# Patient Record
Sex: Female | Born: 1970 | Race: Black or African American | Hispanic: No | Marital: Single | Smoking: Never smoker
Health system: Southern US, Community
[De-identification: ages and names within clinical notes are randomized; demographics above are authoritative.]

## PROBLEM LIST (undated history)

## (undated) DIAGNOSIS — D649 Anemia, unspecified: Secondary | ICD-10-CM

## (undated) DIAGNOSIS — K219 Gastro-esophageal reflux disease without esophagitis: Secondary | ICD-10-CM

## (undated) DIAGNOSIS — D242 Benign neoplasm of left breast: Secondary | ICD-10-CM

## (undated) DIAGNOSIS — I10 Essential (primary) hypertension: Secondary | ICD-10-CM

## (undated) DIAGNOSIS — J45909 Unspecified asthma, uncomplicated: Secondary | ICD-10-CM

## (undated) DIAGNOSIS — N189 Chronic kidney disease, unspecified: Secondary | ICD-10-CM

## (undated) DIAGNOSIS — Z8639 Personal history of other endocrine, nutritional and metabolic disease: Secondary | ICD-10-CM

## (undated) DIAGNOSIS — A6000 Herpesviral infection of urogenital system, unspecified: Secondary | ICD-10-CM

## (undated) DIAGNOSIS — E119 Type 2 diabetes mellitus without complications: Secondary | ICD-10-CM

## (undated) DIAGNOSIS — Z9289 Personal history of other medical treatment: Secondary | ICD-10-CM

## (undated) HISTORY — PX: TUBAL LIGATION: SHX77

## (undated) HISTORY — DX: Herpesviral infection of urogenital system, unspecified: A60.00

## (undated) HISTORY — PX: WISDOM TOOTH EXTRACTION: SHX21

## (undated) HISTORY — DX: Chronic kidney disease, unspecified: N18.9

---

## 1997-11-18 ENCOUNTER — Other Ambulatory Visit: Admission: RE | Admit: 1997-11-18 | Discharge: 1997-11-18 | Payer: Self-pay | Admitting: Obstetrics and Gynecology

## 1997-12-02 ENCOUNTER — Other Ambulatory Visit: Admission: RE | Admit: 1997-12-02 | Discharge: 1997-12-02 | Payer: Self-pay | Admitting: Obstetrics & Gynecology

## 1997-12-29 ENCOUNTER — Other Ambulatory Visit: Admission: RE | Admit: 1997-12-29 | Discharge: 1997-12-29 | Payer: Self-pay | Admitting: Obstetrics & Gynecology

## 1998-01-12 ENCOUNTER — Other Ambulatory Visit: Admission: RE | Admit: 1998-01-12 | Discharge: 1998-01-12 | Payer: Self-pay | Admitting: Obstetrics and Gynecology

## 1998-01-15 ENCOUNTER — Inpatient Hospital Stay (HOSPITAL_COMMUNITY): Admission: AD | Admit: 1998-01-15 | Discharge: 1998-01-15 | Payer: Self-pay | Admitting: Obstetrics & Gynecology

## 1998-01-27 ENCOUNTER — Other Ambulatory Visit: Admission: RE | Admit: 1998-01-27 | Discharge: 1998-01-27 | Payer: Self-pay | Admitting: Obstetrics and Gynecology

## 1998-01-28 ENCOUNTER — Inpatient Hospital Stay (HOSPITAL_COMMUNITY): Admission: AD | Admit: 1998-01-28 | Discharge: 1998-01-30 | Payer: Self-pay | Admitting: Obstetrics and Gynecology

## 1998-04-11 ENCOUNTER — Ambulatory Visit (HOSPITAL_COMMUNITY): Admission: RE | Admit: 1998-04-11 | Discharge: 1998-04-11 | Payer: Self-pay | Admitting: Obstetrics & Gynecology

## 2002-01-09 ENCOUNTER — Encounter: Payer: Self-pay | Admitting: Emergency Medicine

## 2002-01-09 ENCOUNTER — Emergency Department (HOSPITAL_COMMUNITY): Admission: EM | Admit: 2002-01-09 | Discharge: 2002-01-10 | Payer: Self-pay | Admitting: *Deleted

## 2007-04-23 ENCOUNTER — Encounter: Admission: RE | Admit: 2007-04-23 | Discharge: 2007-04-23 | Payer: Self-pay | Admitting: Internal Medicine

## 2010-07-27 ENCOUNTER — Emergency Department (HOSPITAL_COMMUNITY)
Admission: EM | Admit: 2010-07-27 | Discharge: 2010-07-28 | Payer: Self-pay | Source: Home / Self Care | Admitting: Emergency Medicine

## 2010-10-31 LAB — DIFFERENTIAL
Basophils Absolute: 0 10*3/uL (ref 0.0–0.1)
Basophils Relative: 0 % (ref 0–1)
Eosinophils Absolute: 0.1 10*3/uL (ref 0.0–0.7)
Eosinophils Relative: 1 % (ref 0–5)
Lymphocytes Relative: 34 % (ref 12–46)
Lymphs Abs: 2.8 10*3/uL (ref 0.7–4.0)
Monocytes Absolute: 0.4 10*3/uL (ref 0.1–1.0)
Monocytes Relative: 5 % (ref 3–12)
Neutro Abs: 4.8 10*3/uL (ref 1.7–7.7)
Neutrophils Relative %: 60 % (ref 43–77)

## 2010-10-31 LAB — CBC
HCT: 27.3 % — ABNORMAL LOW (ref 36.0–46.0)
Hemoglobin: 8.2 g/dL — ABNORMAL LOW (ref 12.0–15.0)
MCH: 20.7 pg — ABNORMAL LOW (ref 26.0–34.0)
MCHC: 30 g/dL (ref 30.0–36.0)
MCV: 68.8 fL — ABNORMAL LOW (ref 78.0–100.0)
Platelets: 294 10*3/uL (ref 150–400)
RBC: 3.97 MIL/uL (ref 3.87–5.11)
RDW: 17.7 % — ABNORMAL HIGH (ref 11.5–15.5)
WBC: 8.1 10*3/uL (ref 4.0–10.5)

## 2010-10-31 LAB — COMPREHENSIVE METABOLIC PANEL
ALT: 23 U/L (ref 0–35)
AST: 31 U/L (ref 0–37)
Albumin: 3.5 g/dL (ref 3.5–5.2)
Alkaline Phosphatase: 59 U/L (ref 39–117)
BUN: 9 mg/dL (ref 6–23)
CO2: 25 mEq/L (ref 19–32)
Calcium: 9.5 mg/dL (ref 8.4–10.5)
Chloride: 105 mEq/L (ref 96–112)
Creatinine, Ser: 1.11 mg/dL (ref 0.4–1.2)
GFR calc Af Amer: >60 mL/min (ref >60)
GFR calc non Af Amer: 55 mL/min — ABNORMAL LOW (ref >60)
Glucose, Bld: 137 mg/dL — ABNORMAL HIGH (ref 70–99)
Potassium: 3.3 mEq/L — ABNORMAL LOW (ref 3.5–5.1)
Sodium: 136 mEq/L (ref 135–145)
Total Bilirubin: 0.4 mg/dL (ref 0.3–1.2)
Total Protein: 7.4 g/dL (ref 6.0–8.3)

## 2011-01-17 ENCOUNTER — Other Ambulatory Visit: Payer: Self-pay | Admitting: Family Medicine

## 2011-01-17 ENCOUNTER — Ambulatory Visit
Admission: RE | Admit: 2011-01-17 | Discharge: 2011-01-17 | Disposition: A | Discharge status: Home / Self Care | Payer: Medicaid Other | Source: Ambulatory Visit | Attending: Family Medicine | Admitting: Family Medicine

## 2011-01-17 DIAGNOSIS — R000 Tachycardia, unspecified: Secondary | ICD-10-CM

## 2011-01-17 DIAGNOSIS — I10 Essential (primary) hypertension: Secondary | ICD-10-CM

## 2013-05-06 ENCOUNTER — Inpatient Hospital Stay (HOSPITAL_COMMUNITY)
Admission: EM | Admit: 2013-05-06 | Discharge: 2013-05-07 | DRG: 812 | Disposition: A | Discharge status: Home / Self Care | Payer: Medicaid Other | Attending: Internal Medicine | Admitting: Internal Medicine

## 2013-05-06 ENCOUNTER — Emergency Department (HOSPITAL_COMMUNITY): Payer: Medicaid Other

## 2013-05-06 ENCOUNTER — Encounter (HOSPITAL_COMMUNITY): Payer: Self-pay | Admitting: Emergency Medicine

## 2013-05-06 ENCOUNTER — Inpatient Hospital Stay (HOSPITAL_COMMUNITY): Payer: Medicaid Other

## 2013-05-06 DIAGNOSIS — I2489 Other forms of acute ischemic heart disease: Secondary | ICD-10-CM | POA: Diagnosis present

## 2013-05-06 DIAGNOSIS — E876 Hypokalemia: Secondary | ICD-10-CM | POA: Diagnosis present

## 2013-05-06 DIAGNOSIS — D62 Acute posthemorrhagic anemia: Principal | ICD-10-CM | POA: Diagnosis present

## 2013-05-06 DIAGNOSIS — R079 Chest pain, unspecified: Secondary | ICD-10-CM | POA: Diagnosis present

## 2013-05-06 DIAGNOSIS — D649 Anemia, unspecified: Secondary | ICD-10-CM

## 2013-05-06 DIAGNOSIS — R531 Weakness: Secondary | ICD-10-CM | POA: Diagnosis present

## 2013-05-06 DIAGNOSIS — Z79899 Other long term (current) drug therapy: Secondary | ICD-10-CM

## 2013-05-06 DIAGNOSIS — I248 Other forms of acute ischemic heart disease: Secondary | ICD-10-CM | POA: Diagnosis present

## 2013-05-06 DIAGNOSIS — D509 Iron deficiency anemia, unspecified: Secondary | ICD-10-CM | POA: Diagnosis present

## 2013-05-06 DIAGNOSIS — R911 Solitary pulmonary nodule: Secondary | ICD-10-CM | POA: Diagnosis present

## 2013-05-06 DIAGNOSIS — I10 Essential (primary) hypertension: Secondary | ICD-10-CM | POA: Diagnosis present

## 2013-05-06 HISTORY — DX: Essential (primary) hypertension: I10

## 2013-05-06 LAB — CBC
MCH: 16.8 pg — ABNORMAL LOW (ref 26.0–34.0)
MCH: 17.3 pg — ABNORMAL LOW (ref 26.0–34.0)
MCHC: 27.5 g/dL — ABNORMAL LOW (ref 30.0–36.0)
MCHC: 28.4 g/dL — ABNORMAL LOW (ref 30.0–36.0)
Platelets: 320 10*3/uL (ref 150–400)
RDW: 19.6 % — ABNORMAL HIGH (ref 11.5–15.5)
RDW: 19.6 % — ABNORMAL HIGH (ref 11.5–15.5)

## 2013-05-06 LAB — RAPID URINE DRUG SCREEN, HOSP PERFORMED
Amphetamines: NOT DETECTED
Benzodiazepines: NOT DETECTED
Opiates: NOT DETECTED

## 2013-05-06 LAB — BASIC METABOLIC PANEL
Calcium: 10.2 mg/dL (ref 8.4–10.5)
GFR calc Af Amer: >90 mL/min (ref >90)
GFR calc non Af Amer: 81 mL/min — ABNORMAL LOW (ref >90)
Potassium: 2.4 mEq/L — CL (ref 3.5–5.1)
Sodium: 137 mEq/L (ref 135–145)

## 2013-05-06 LAB — COMPREHENSIVE METABOLIC PANEL
Alkaline Phosphatase: 49 U/L (ref 39–117)
BUN: 9 mg/dL (ref 6–23)
Chloride: 97 mEq/L (ref 96–112)
Creatinine, Ser: 0.81 mg/dL (ref 0.50–1.10)
GFR calc Af Amer: >90 mL/min (ref >90)
GFR calc non Af Amer: 88 mL/min — ABNORMAL LOW (ref >90)
Glucose, Bld: 102 mg/dL — ABNORMAL HIGH (ref 70–99)
Potassium: 2.7 mEq/L — CL (ref 3.5–5.1)
Total Bilirubin: 0.2 mg/dL — ABNORMAL LOW (ref 0.3–1.2)

## 2013-05-06 LAB — RETICULOCYTES: RBC.: 3.75 MIL/uL — ABNORMAL LOW (ref 3.87–5.11)

## 2013-05-06 LAB — POCT I-STAT TROPONIN I: Troponin i, poc: 0.01 ng/mL (ref 0.00–0.08)

## 2013-05-06 LAB — TROPONIN I: Troponin I: <0.3 ng/mL (ref <0.30)

## 2013-05-06 LAB — MAGNESIUM: Magnesium: 1.2 mg/dL — ABNORMAL LOW (ref 1.5–2.5)

## 2013-05-06 LAB — ABO/RH: ABO/RH(D): AB POS

## 2013-05-06 LAB — PHOSPHORUS: Phosphorus: 3.4 mg/dL (ref 2.3–4.6)

## 2013-05-06 MED ORDER — POTASSIUM CHLORIDE CRYS ER 20 MEQ PO TBCR
40.0000 meq | EXTENDED_RELEASE_TABLET | Freq: Once | ORAL | Status: AC
  Administered 2013-05-06: 40 meq via ORAL
  Filled 2013-05-06: qty 2

## 2013-05-06 MED ORDER — SODIUM CHLORIDE 0.9 % IV SOLN
INTRAVENOUS | Status: AC
  Administered 2013-05-06: 20:00:00 via INTRAVENOUS

## 2013-05-06 MED ORDER — HYDROMORPHONE HCL PF 1 MG/ML IJ SOLN
1.0000 mg | INTRAMUSCULAR | Status: DC | PRN
  Administered 2013-05-06: 1 mg via INTRAVENOUS
  Filled 2013-05-06: qty 1

## 2013-05-06 MED ORDER — SIMVASTATIN 10 MG PO TABS
10.0000 mg | ORAL_TABLET | Freq: Every day | ORAL | Status: DC
  Administered 2013-05-07: 10 mg via ORAL
  Filled 2013-05-06: qty 1

## 2013-05-06 MED ORDER — INFLUENZA VAC SPLIT QUAD 0.5 ML IM SUSP
0.5000 mL | INTRAMUSCULAR | Status: AC | PRN
  Administered 2013-05-07: 18:00:00 0.5 mL via INTRAMUSCULAR
  Filled 2013-05-06 (×2): qty 0.5

## 2013-05-06 MED ORDER — PANTOPRAZOLE SODIUM 40 MG PO TBEC
40.0000 mg | DELAYED_RELEASE_TABLET | Freq: Every day | ORAL | Status: DC
  Administered 2013-05-07: 10:00:00 40 mg via ORAL
  Filled 2013-05-06: qty 1

## 2013-05-06 MED ORDER — SODIUM CHLORIDE 0.9 % IV SOLN: INTRAVENOUS | Status: AC

## 2013-05-06 MED ORDER — SODIUM CHLORIDE 0.9 % IJ SOLN: 3.0000 mL | Freq: Two times a day (BID) | INTRAMUSCULAR | Status: DC

## 2013-05-06 MED ORDER — POTASSIUM CHLORIDE CRYS ER 20 MEQ PO TBCR
40.0000 meq | EXTENDED_RELEASE_TABLET | Freq: Once | ORAL | Status: AC
  Administered 2013-05-06: 40 meq via ORAL
  Filled 2013-05-06 (×2): qty 2

## 2013-05-06 MED ORDER — ONDANSETRON HCL 4 MG PO TABS: 4.0000 mg | ORAL_TABLET | Freq: Four times a day (QID) | ORAL | Status: DC | PRN

## 2013-05-06 MED ORDER — POTASSIUM CHLORIDE 10 MEQ/100ML IV SOLN
10.0000 meq | Freq: Once | INTRAVENOUS | Status: AC
  Administered 2013-05-06: 10 meq via INTRAVENOUS
  Filled 2013-05-06 (×2): qty 100

## 2013-05-06 MED ORDER — AMLODIPINE BESYLATE 10 MG PO TABS
10.0000 mg | ORAL_TABLET | Freq: Every day | ORAL | Status: DC
  Administered 2013-05-07: 10:00:00 10 mg via ORAL
  Filled 2013-05-06: qty 1

## 2013-05-06 MED ORDER — MAGNESIUM SULFATE 40 MG/ML IJ SOLN
4.0000 g | Freq: Once | INTRAMUSCULAR | Status: AC
  Administered 2013-05-06: 23:00:00 4 g via INTRAVENOUS
  Filled 2013-05-06: qty 100

## 2013-05-06 MED ORDER — HYDROCODONE-ACETAMINOPHEN 5-325 MG PO TABS
1.0000 | ORAL_TABLET | ORAL | Status: DC | PRN
  Filled 2013-05-06: qty 1

## 2013-05-06 MED ORDER — ONDANSETRON HCL 4 MG/2ML IJ SOLN: 4.0000 mg | Freq: Four times a day (QID) | INTRAMUSCULAR | Status: DC | PRN

## 2013-05-06 NOTE — Progress Notes (Signed)
Utilization Review completed.  Nevaeha Finerty RN CM  

## 2013-05-06 NOTE — ED Provider Notes (Signed)
CSN: [id number]     Arrival date & time 05/06/13  1607 History   First MD Initiated Contact with Patient 05/06/13 1610     Chief Complaint  Patient presents with  . Chest Pain   (Consider location/radiation/quality/duration/timing/severity/associated sxs/prior Treatment) HPI Comments: 42 year old female with a past medical history of hypertension presents to the emergency department complaining of chest pain beginning when she woke up this morning. Patient states throughout the day the pain has increased, located on the right side of her chest, described as sharp, constant, nonradiating rated 6/10. Pain worse with deep inspiration or if she presses on her chest. Denies having chest pain like this in the past. She has been feeling fatigued at the end of the day if she does a lot of activity. Denies associated shortness of breath, nausea, vomiting, diaphoresis, cough or recent illness. States she is under a lot of increased stress with school and sending her daughter off to college. Non-smoker, no family hx of cardiac problems, blood clots.  Patient is a 42 y.o. female presenting with chest pain. The history is provided by the patient.  Chest Pain Associated symptoms: fatigue   Associated symptoms: no back pain, no diaphoresis, no dizziness, no fever, no headache, no nausea, no shortness of breath, not vomiting and no weakness     Past Medical History  Diagnosis Date  . Hypertension    History reviewed. No pertinent past surgical history. No family history on file. History  Substance Use Topics  . Smoking status: Never Smoker   . Smokeless tobacco: Not on file  . Alcohol Use: Not on file   OB History   Grav Para Term Preterm Abortions TAB SAB Ect Mult Living                 Review of Systems  Constitutional: Positive for fatigue. Negative for fever, chills and diaphoresis.  Respiratory: Negative for shortness of breath.   Cardiovascular: Positive for chest pain. Negative for leg  swelling.  Gastrointestinal: Negative for nausea and vomiting.  Musculoskeletal: Negative for back pain.  Skin: Negative for rash.  Neurological: Negative for dizziness, weakness, light-headedness and headaches.  All other systems reviewed and are negative.    Allergies  Review of patient's allergies indicates not on file.  Home Medications  No current outpatient prescriptions on file. BP 203/91  Pulse 103  Resp 20  SpO2 100%  LMP 04/22/2013 Physical Exam  Nursing note and vitals reviewed. Constitutional: She is oriented to person, place, and time. She appears well-developed and well-nourished. No distress.  HENT:  Head: Normocephalic and atraumatic.  Mouth/Throat: Oropharynx is clear and moist.  Eyes: Conjunctivae and EOM are normal. Pupils are equal, round, and reactive to light.  Neck: Normal range of motion. Neck supple.  Cardiovascular: Normal rate, regular rhythm, normal heart sounds and intact distal pulses.   No extremity edema.  Pulmonary/Chest: Effort normal and breath sounds normal. She exhibits tenderness.    Abdominal: Soft. Bowel sounds are normal. There is no tenderness.  Genitourinary: Rectal exam shows external hemorrhoid (no thrombosis, bleeding). Rectal exam shows no internal hemorrhoid, no fissure, no mass and no tenderness.  No gross blood on exam glove.  Musculoskeletal: Normal range of motion. She exhibits no edema.  Neurological: She is alert and oriented to person, place, and time. She has normal strength. No sensory deficit.  Skin: Skin is warm and dry. She is not diaphoretic.  Psychiatric: She has a normal mood and affect. Her behavior is normal.  ED Course  Procedures (including critical care time) Labs Review Labs Reviewed  CBC - Abnormal; Notable for the following:    RBC 3.74 (*)    Hemoglobin 6.3 (*)    HCT 22.9 (*)    MCV 61.2 (*)    MCH 16.8 (*)    MCHC 27.5 (*)    RDW 19.6 (*)    All other components within normal limits    BASIC METABOLIC PANEL - Abnormal; Notable for the following:    Potassium 2.4 (*)    Glucose, Bld 127 (*)    GFR calc non Af Amer 81 (*)    All other components within normal limits  POCT I-STAT TROPONIN I  OCCULT BLOOD, POC DEVICE  TYPE AND SCREEN  PREPARE RBC (CROSSMATCH)   Imaging Review Dg Chest 2 View  05/06/2013   CLINICAL DATA:  Chest pain and hypertension  EXAM: CHEST  2 VIEW  COMPARISON:  Jan 17, 2011  FINDINGS: There is a 1.9 x 1.5 cm opacity in the medial left base region, seen only on the frontal view. Lungs are otherwise clear. Heart size and pulmonary vascularity are normal. No adenopathy. No bone lesions.  IMPRESSION: Nodular opacity medial left base seen only on frontal view. Given this apparent change from the prior study, non contrast enhanced chest CT advised to further assess. Lungs are otherwise clear.   Electronically Signed   By: Lowella Grip   On: 05/06/2013 18:13     Date: 05/06/2013  Rate: 89  Rhythm: normal sinus rhythm and premature ventricular contractions (PVC)  QRS Axis: normal  Intervals: normal  ST/T Wave abnormalities: normal  Conduction Disutrbances:nonspecific intraventricular conduction delay borderline  Narrative Interpretation: no stemi  Old EKG Reviewed: changes noted from EKG obtained on 07/27/2010- borderline IVCD   MDM   1. Anemia   2. Hypokalemia   3. Chest pain      Patient with chest pain, gradually increasing throughout the day. No associated symptoms. Chest pain reproducible. Doubt CAD, PE, infectious process. HR on my examination 83, no tachycardia, tachypnea or hypoxia. She is well appearing and in no apparent distress. Patient under increased stress. Given history of hypertension, will rule out any cardiac cause of her chest pain. Labs pending- CBC, BMP, troponin. Chest x-ray pending. EKG without any st/t changes, no stemi. 6:05 PM Hgb 6.3. Denies hx of anemia. Fecal occult blood negative. Not on menses currently, but has  occasional heavy periods. Will type and screen, prepare for transfusion, admit patient. Troponin negative. 6:16 PM Potassium 2.4. 40 mEq PO potassium and IV potassium given. 6:45 PM Admission accepted by Dr. Doyle Askew, [facility].   Illene Labrador, PA-C 05/06/13 1845

## 2013-05-06 NOTE — H&P (Signed)
[facility] History and Physical  Renee Alexander [id number] DOB: 08-18-1971 DOA: 05/06/2013  Referring physician: ED physician PCP: No primary provider on file.   Chief Complaint: Weakness  HPI:  Pt is 42 year old female with a past medical history of hypertension who presented to [facility] ED with main concern of progressively worsening generalized weakness that she initially noted over the past week and associated with intermittent episodes of right sided, sharp chest pain, 6/10 in severity and worse with inspiration, no specific alleviating factors. Pt denies similar events in the past, no fevers, chills, no shortness of breath, no specific abdominal or urinary concerns.  In ED, pt overall stable but found to have Hg 6.3, K 2.4. 2 units of PRBC ordered an pt has received total of 50 MEQ of potassium. TRH asked to admit for further evaluation.   Assessment and Plan:  Principal Problem:   Weakness generalized - most likely secondary to acute blood loss anemia - will admit to telemetry bed and will monitor vitals per protocol - will provide IVF and order 2 units of PRBC - ambulate in AM Active Problems:   Acute blood loss anemia - unclear etiology, review of record show Hg ~8 in 2011 - will order anemia panel, please note that FOBT done in ED was negative - post transfusion H/H - CBC in AM   Hypokalemia - supplement and check MG level - BMP in AM   Chest pain - most likely demand ischemia from blood loss - provide supportive care with analgesia - 2 U PRBC ordered - cycle CE's  Code Status: Full Family Communication: Pt at bedside Disposition Plan: Admit to telemetry bed   Review of Systems:  Constitutional: Negative for fever, chills. Negative for diaphoresis.  HENT: Negative for hearing loss, ear pain, nosebleeds, congestion, sore throat, neck pain, tinnitus and ear discharge.   Eyes: Negative for blurred vision, double vision, photophobia, pain, discharge and  redness.  Respiratory: Negative for cough, hemoptysis, sputum production, shortness of breath, wheezing and stridor.   Cardiovascular: Negative for palpitations, orthopnea, claudication and leg swelling.  Gastrointestinal: Negative for nausea, vomiting and abdominal pain. Negative for heartburn, constipation, blood in stool and melena.  Genitourinary: Negative for dysuria, urgency, frequency, hematuria and flank pain.  Musculoskeletal: Negative for myalgias, back pain, joint pain and falls.  Skin: Negative for itching and rash.  Neurological: Negative for tingling, tremors, sensory change, speech change, focal weakness, loss of consciousness and headaches.  Endo/Heme/Allergies: Negative for environmental allergies and polydipsia. Does not bruise/bleed easily.  Psychiatric/Behavioral: Negative for suicidal ideas. The patient is not nervous/anxious.      Past Medical History  Diagnosis Date  . Hypertension     History reviewed. No pertinent past surgical history.  Social History:  reports that she has never smoked. She does not have any smokeless tobacco history on file. Her alcohol and drug histories are not on file.  No Known Allergies  No family history of heart diseases  Prior to Admission medications   Medication Sig Start Date End Date Taking? Authorizing Provider  amLODipine (NORVASC) 10 MG tablet Take 10 mg by mouth daily.   Yes Historical Provider, MD  hydrochlorothiazide (HYDRODIURIL) 25 MG tablet Take 25 mg by mouth daily.   Yes Historical Provider, MD  omeprazole (PRILOSEC) 20 MG capsule Take 20 mg by mouth daily.   Yes Historical Provider, MD  pravastatin (PRAVACHOL) 20 MG tablet Take 20 mg by mouth daily.   Yes Historical Provider, MD  Physical Exam: Filed Vitals:   05/06/13 1612  BP: 203/91  Pulse: 103  Resp: 20  SpO2: 100%    Physical Exam  Constitutional: Appears well-developed and well-nourished. No distress.  HENT: Normocephalic. External right and left  ear normal. Oropharynx is clear and moist.  Eyes: Conjunctivae and EOM are normal. PERRLA, no scleral icterus.  Neck: Normal ROM. Neck supple. No JVD. No tracheal deviation. No thyromegaly.  CVS: Regular rhythm, tachycardic, S1/S2 +, no murmurs, no gallops, no carotid bruit.  Pulmonary: Effort and breath sounds normal, no stridor, rhonchi, wheezes, rales.  Abdominal: Soft. BS +,  no distension, tenderness, rebound or guarding.  Musculoskeletal: Normal range of motion. No edema and no tenderness.  Lymphadenopathy: No lymphadenopathy noted, cervical, inguinal. Neuro: Alert. Normal reflexes, muscle tone coordination. No cranial nerve deficit. Skin: Skin is warm and dry. No rash noted. Not diaphoretic. No erythema. No pallor.  Psychiatric: Normal mood and affect. Behavior, judgment, thought content normal.   Labs on Admission:  Basic Metabolic Panel:  Recent Labs Lab 05/06/13 1717  NA 137  K 2.4*  CL 96  CO2 29  GLUCOSE 127*  BUN 11  CREATININE 0.87  CALCIUM 10.2   CBC:  Recent Labs Lab 05/06/13 1717  WBC 4.7  HGB 6.3*  HCT 22.9*  MCV 61.2*  PLT 336   Radiological Exams on Admission: Dg Chest 2 View  05/06/2013   CLINICAL DATA:  Chest pain and hypertension  EXAM: CHEST  2 VIEW  COMPARISON:  Jan 17, 2011  FINDINGS: There is a 1.9 x 1.5 cm opacity in the medial left base region, seen only on the frontal view. Lungs are otherwise clear. Heart size and pulmonary vascularity are normal. No adenopathy. No bone lesions.  IMPRESSION: Nodular opacity medial left base seen only on frontal view. Given this apparent change from the prior study, non contrast enhanced chest CT advised to further assess. Lungs are otherwise clear.   Electronically Signed   By: Lowella Grip   On: 05/06/2013 18:13    EKG: Normal sinus rhythm, no ST/T wave changes  Faye Ramsay, MD  [facility] Pager [phone]  If 7PM-7AM, please contact night-coverage [email or web address] Password  [employer] 05/06/2013, 7:04 PM

## 2013-05-06 NOTE — Progress Notes (Signed)
CRITICAL VALUE ALERT  Critical value received:  K 2.7  Date of notification:  05/06/2013   Time of notification:  2244  Critical value read back:yes  Nurse who received alert:  Sharyon Medicus, RN  MD notified (1st page):  Baltazar Najjar, NP  Time of first page:  2244  MD notified (2nd page):  Time of second page:  Responding MD:  No response, K being replaced   Time MD responded:

## 2013-05-06 NOTE — ED Notes (Signed)
Pt c/o R sided chest pain since waking up this morning. Pt states the pain is sharp and constant. Pt states pain is worse with deep inspiration. Pt denies nausea, shortness of breath, or radiation of pain. Pt a/o x 4. Pt calm. Skin warm, dry. No acute distress. Pt arrives to exam room in wheelchair.

## 2013-05-06 NOTE — ED Provider Notes (Signed)
Medical screening examination/treatment/procedure(s) were conducted as a shared visit with non-physician practitioner(s) and myself.  I personally evaluated the patient during the encounter  1 day history of right-sided chest pain that is constant and worse with deep inspiration worse with pressure. EKG normal sinus. Lungs clear, heart regular. Worsening anemia. she is up at  Ezequiel Essex, MD 05/06/13 2207

## 2013-05-07 ENCOUNTER — Inpatient Hospital Stay (HOSPITAL_COMMUNITY): Payer: Medicaid Other

## 2013-05-07 ENCOUNTER — Encounter (HOSPITAL_COMMUNITY): Payer: Self-pay | Admitting: Radiology

## 2013-05-07 DIAGNOSIS — D509 Iron deficiency anemia, unspecified: Secondary | ICD-10-CM

## 2013-05-07 DIAGNOSIS — R5381 Other malaise: Secondary | ICD-10-CM

## 2013-05-07 LAB — IRON AND TIBC
Saturation Ratios: 3 % — ABNORMAL LOW (ref 20–55)
TIBC: 433 ug/dL (ref 250–470)
UIBC: 420 ug/dL — ABNORMAL HIGH (ref 125–400)

## 2013-05-07 LAB — BASIC METABOLIC PANEL
CO2: 28 mEq/L (ref 19–32)
Calcium: 9.4 mg/dL (ref 8.4–10.5)
Creatinine, Ser: 0.74 mg/dL (ref 0.50–1.10)
Glucose, Bld: 148 mg/dL — ABNORMAL HIGH (ref 70–99)

## 2013-05-07 LAB — CBC
MCH: 19.5 pg — ABNORMAL LOW (ref 26.0–34.0)
MCV: 65.8 fL — ABNORMAL LOW (ref 78.0–100.0)
Platelets: 272 10*3/uL (ref 150–400)
RDW: 22.4 % — ABNORMAL HIGH (ref 11.5–15.5)

## 2013-05-07 LAB — VITAMIN B12: Vitamin B-12: 459 pg/mL (ref 211–911)

## 2013-05-07 LAB — TSH: TSH: 1.839 u[IU]/mL (ref 0.350–4.500)

## 2013-05-07 LAB — FOLATE: Folate: 15.9 ng/mL

## 2013-05-07 MED ORDER — HYDROXYZINE HCL 10 MG PO TABS
10.0000 mg | ORAL_TABLET | Freq: Once | ORAL | Status: AC
  Administered 2013-05-07: 10 mg via ORAL
  Filled 2013-05-07: qty 1

## 2013-05-07 MED ORDER — FERROUS SULFATE 325 (65 FE) MG PO TABS
325.0000 mg | ORAL_TABLET | Freq: Two times a day (BID) | ORAL | Status: DC
  Administered 2013-05-07: 325 mg via ORAL
  Filled 2013-05-07 (×2): qty 1

## 2013-05-07 MED ORDER — DIPHENHYDRAMINE HCL 25 MG PO CAPS
ORAL_CAPSULE | ORAL | Status: AC
  Filled 2013-05-07: qty 1

## 2013-05-07 MED ORDER — DIPHENHYDRAMINE HCL 25 MG PO CAPS
25.0000 mg | ORAL_CAPSULE | Freq: Once | ORAL | Status: AC
  Administered 2013-05-07: 25 mg via ORAL
  Filled 2013-05-07: qty 1

## 2013-05-07 MED ORDER — DIPHENHYDRAMINE HCL 25 MG PO CAPS
25.0000 mg | ORAL_CAPSULE | Freq: Once | ORAL | Status: AC
  Administered 2013-05-07: 02:00:00 25 mg via ORAL

## 2013-05-07 MED ORDER — MAGNESIUM SULFATE 40 MG/ML IJ SOLN
2.0000 g | Freq: Once | INTRAMUSCULAR | Status: DC
  Filled 2013-05-07: qty 50

## 2013-05-07 MED ORDER — POTASSIUM CHLORIDE CRYS ER 20 MEQ PO TBCR
40.0000 meq | EXTENDED_RELEASE_TABLET | Freq: Once | ORAL | Status: AC
  Administered 2013-05-07: 40 meq via ORAL
  Filled 2013-05-07: qty 2

## 2013-05-07 MED ORDER — FERROUS SULFATE 325 (65 FE) MG PO TABS: 325.0000 mg | ORAL_TABLET | Freq: Two times a day (BID) | ORAL | Status: DC

## 2013-05-07 MED ORDER — SODIUM CHLORIDE 0.9 % IV SOLN
1020.0000 mg | Freq: Once | INTRAVENOUS | Status: AC
  Administered 2013-05-07: 12:00:00 1020 mg via INTRAVENOUS
  Filled 2013-05-07: qty 34

## 2013-05-07 MED ORDER — POTASSIUM CHLORIDE 10 MEQ/100ML IV SOLN
10.0000 meq | INTRAVENOUS | Status: AC
  Administered 2013-05-07 (×4): 10 meq via INTRAVENOUS
  Filled 2013-05-07 (×4): qty 100

## 2013-05-07 NOTE — Care Management Note (Signed)
    Page 1 of 1   05/07/2013     11:03:53 AM   CARE MANAGEMENT NOTE 05/07/2013  Patient:  Renee Alexander, Renee Alexander.   Account Number:  [id number]  Date Initiated:  05/07/2013  Documentation initiated by:  [facility]  Subjective/Objective Assessment:   42 Y/O F ADMITTED W/BLOOD LOSS ANEMIA.     Action/Plan:   FROM HOME.HAS PCP,PHARMACY.   Anticipated DC Date:  05/07/2013   Anticipated DC Plan:  [place]  CM consult      Choice offered to / List presented to:             Status of service:  Completed, signed off Medicare Important Message given?   (If response is "NO", the following Medicare IM given date fields will be blank) Date Medicare IM given:   Date Additional Medicare IM given:    Discharge Disposition:  HOME/SELF CARE  Per UR Regulation:  Reviewed for med. necessity/level of care/duration of stay  If discussed at [place] of Stay Meetings, dates discussed:    Comments:  05/06/13 Kyelle Urbas RN,BSN NCM [phone] D/C HOME NO [place].

## 2013-05-07 NOTE — Progress Notes (Signed)
CRITICAL VALUE ALERT   Critical value received:  K 2.7  Date of notification:  05/07/2013  Time of notification:  1000  Critical value read back:yes  Nurse who received alert:  Rulon Abide, RN  MD notified (1st page):  TAT  Time of first page:  58  MD notified (2nd page):  Time of second page:  Responding MD:  TAT  Time MD responded:  1006

## 2013-05-07 NOTE — Discharge Summary (Addendum)
Physician Discharge Summary  Renee Alexander [id number] DOB: [birth year] DOA: 05/06/2013  PCP: No primary provider on file.  Admit date: 05/06/2013 Discharge date: 05/07/2013  Recommendations for Outpatient Follow-up:  1. Pt will need to follow up with PCP in 1 weeks post discharge 2. Please obtain BMP to evaluate electrolytes and kidney function 3. Please also check CBC to evaluate Hg and Hct levels 4. Please follow up on thyroid nodule found incidentally on CT of chest   Discharge Diagnoses:  Principal Problem:   Weakness generalized Active Problems:   Acute blood loss anemia   Hypokalemia   Chest pain  generalized weakness - Likely due to the patient's anemia - Improved after 2 units PRBC - TSH 1.839 Iron deficiency anemia - Iron saturation 3%, ferritin 2 - Gave patient a dose of Feraheme - Start ferrous sulfate 325 mg twice a day - FOBT negative - Hemoglobin 8.6 after transfusion - Serum B12 459, folate 15.9 Chest discomfort - Resolved - Troponins negative x3 - EKG-negative for ST-T wave change - May have been related to the patient's anemia - Wells' score 0 Hypokalemia - May be due to the patient's HCTZ as well as hypomagnesemia - Magnesium and potassium were repleted - Patient will need to follow up with primary care physician to recheck electrolytes in one week Thyroid nodule -found incidentally on CT chest -TSH 1.839 Hypertension - Continue amlodipine and HCTZ Discharge Condition: stable  Disposition: home  Diet:heart healthy Wt Readings from Last 3 Encounters:  05/07/13 101.1 kg (222 lb 14.2 oz)    History of present illness:  42 year old female with a past medical history of hypertension who presented to [facility] ED with main concern of progressively worsening generalized weakness that she initially noted over the past week and associated with intermittent episodes of right sided, sharp chest pain. The patient denied any shortness of breath, PND,  orthopnea. EKG was negative for any ST-T wave changes. Troponins were negative x2. i-STAT troponin was also neg. the patient was noted to have a hemoglobin of 6.5. The patient was transfused with 2 units PRBCs. Prior to transfusion, iron studies revealed iron saturation of 3%. The patient was given an intravenous dose of IV iron, and she was instructed to start ferrous sulfate 325 mg twice a day. The patient's generalized weakness and chest discomfort resolved after the blood transfusion. The patient was noted to be hypokalemic and hypomagnesemic. Her electrolytes were repleted. The patient was instructed to followup with her primary care physician in one week to recheck her electrolytes. FOBT was neg     Discharge Exam: Filed Vitals:   05/07/13 0655  BP: 145/73  Pulse: 74  Temp: 98.2 F (36.8 C)  Resp: 16   Filed Vitals:   05/07/13 0429 05/07/13 0455 05/07/13 0555 05/07/13 0655  BP: 139/61 131/68 149/81 145/73  Pulse: 71 73 71 74  Temp: 98.2 F (36.8 C) 98.6 F (37 C) 98.3 F (36.8 C) 98.2 F (36.8 C)  TempSrc: Oral Oral Oral Oral  Resp: 18 16 16 16   Height:      Weight:   101.1 kg (222 lb 14.2 oz)   SpO2:  98% 99% 99%   General: A&O x 3, NAD, pleasant, cooperative Cardiovascular: RRR, no rub, no gallop, no S3 Respiratory: CTAB, no wheeze, no rhonchi Abdomen:soft, nontender, nondistended, positive bowel sounds Extremities: No edema, No lymphangitis, no petechiae  Discharge Instructions      Discharge Orders   Future Orders Complete By Expires   Diet -  low sodium heart healthy  As directed    Discharge instructions  As directed    Comments:     Take iron 325mg  twice a day at home Follow up with your family doctor in one week for blood work to recheck your potassium   Increase activity slowly  As directed        Medication List         amLODipine 10 MG tablet  Commonly known as:  NORVASC  Take 10 mg by mouth daily.     ferrous sulfate 325 (65 FE) MG tablet   Take 1 tablet (325 mg total) by mouth 2 (two) times daily with a meal.     hydrochlorothiazide 25 MG tablet  Commonly known as:  HYDRODIURIL  Take 25 mg by mouth daily.     omeprazole 20 MG capsule  Commonly known as:  PRILOSEC  Take 20 mg by mouth daily.     pravastatin 20 MG tablet  Commonly known as:  PRAVACHOL  Take 20 mg by mouth daily.         The results of significant diagnostics from this hospitalization (including imaging, microbiology, ancillary and laboratory) are listed below for reference.    Significant Diagnostic Studies: Dg Chest 2 View  05/06/2013   CLINICAL DATA:  Chest pain and hypertension  EXAM: CHEST  2 VIEW  COMPARISON:  Jan 17, 2011  FINDINGS: There is a 1.9 x 1.5 cm opacity in the medial left base region, seen only on the frontal view. Lungs are otherwise clear. Heart size and pulmonary vascularity are normal. No adenopathy. No bone lesions.  IMPRESSION: Nodular opacity medial left base seen only on frontal view. Given this apparent change from the prior study, non contrast enhanced chest CT advised to further assess. Lungs are otherwise clear.   Electronically Signed   By: Lowella Grip   On: 05/06/2013 18:13   Ct Chest Wo Contrast  05/07/2013   *RADIOLOGY REPORT*  Clinical Data: Chest pain.  Left base lung nodule seen on chest x- ray dated 05/06/2013.  CT CHEST WITHOUT CONTRAST  Technique:  Multidetector CT imaging of the chest was performed following the standard protocol without IV contrast.  Comparison: Chest x-ray dated 05/06/2013  Findings: There is a 2.5 cm nodule in the inferior aspect of the isthmus with a 13 mm cystic component.  The lungs are clear.  The density seen at the left lung base on the prior chest x-ray represents a small para-esophageal hiatal hernia.  The heart and other mediastinal structures are normal.  No effusions.  No osseous abnormalities.  The visualized portion of the upper abdomen is normal.  IMPRESSION:  1.  The abnormality  seen on chest x-ray the is demonstrated be a small para esophageal hiatal hernia. 2.  2.5 cm nodule in the isthmus of the thyroid gland.  If this has not been previously assessed, I recommend ultrasound for further evaluation.  This is a true 2.5 cm nodule, it meets criteria for fine needle aspiration biopsy if not previously evaluated.   Original Report Authenticated By: Lorriane Shire, M.D.     Microbiology: No results found for this or any previous visit (from the past 240 hour(s)).   Labs: Basic Metabolic Panel:  Recent Labs Lab 05/06/13 1717 05/06/13 2058 05/07/13 0920  NA 137 139 137  K 2.4* 2.7* 2.7*  CL 96 97 97  CO2 29 30 28   GLUCOSE 127* 102* 148*  BUN 11 9 8   CREATININE  0.87 0.81 0.74  CALCIUM 10.2 10.2 9.4  MG  --  1.2* 1.7  PHOS  --  3.4  --    Liver Function Tests:  Recent Labs Lab 05/06/13 2058  AST 19  ALT 14  ALKPHOS 49  BILITOT 0.2*  PROT 8.0  ALBUMIN 3.6   No results found for this basename: LIPASE, AMYLASE,  in the last 168 hours No results found for this basename: AMMONIA,  in the last 168 hours CBC:  Recent Labs Lab 05/06/13 1717 05/06/13 2058 05/07/13 0920  WBC 4.7 6.2 5.4  HGB 6.3* 6.5* 8.6*  HCT 22.9* 22.9* 29.0*  MCV 61.2* 61.1* 65.8*  PLT 336 320 272   Cardiac Enzymes:  Recent Labs Lab 05/06/13 2058 05/07/13 0920  TROPONINI <0.30 <0.30   BNP: No components found with this basename: POCBNP,  CBG: No results found for this basename: GLUCAP,  in the last 168 hours  Time coordinating discharge:  Greater than 30 minutes  Signed:  Tykerria Mccubbins, DO [facility] Pager: [phone] 05/07/2013, 11:03 AM

## 2013-05-07 NOTE — Progress Notes (Signed)
Patient complained of itching before unit of PRBC started, paged Baltazar Najjar NP and 25 mg Benadryl ordered as pre-med for blood. About an hour into transfusion, patient complains of worsening itching with no hives, rash, shortness of breath or swelling of the lips or tongue. Baltazar Najjar, NP paged and ordered an additional 25 mg of Benadryl for patient and said to continue with the transfusion. Vitals are stable. Will continue to monitor patient.  Marita Snellen

## 2013-05-08 LAB — TYPE AND SCREEN
ABO/RH(D): AB POS
Unit division: 0

## 2015-06-17 ENCOUNTER — Observation Stay (HOSPITAL_COMMUNITY)
Admission: EM | Admit: 2015-06-17 | Discharge: 2015-06-19 | Disposition: A | Discharge status: Home / Self Care | Payer: Self-pay | Attending: Internal Medicine | Admitting: Internal Medicine

## 2015-06-17 ENCOUNTER — Encounter (HOSPITAL_COMMUNITY): Payer: Self-pay

## 2015-06-17 ENCOUNTER — Emergency Department (HOSPITAL_COMMUNITY): Payer: Self-pay

## 2015-06-17 DIAGNOSIS — N920 Excessive and frequent menstruation with regular cycle: Secondary | ICD-10-CM | POA: Insufficient documentation

## 2015-06-17 DIAGNOSIS — D509 Iron deficiency anemia, unspecified: Principal | ICD-10-CM | POA: Insufficient documentation

## 2015-06-17 DIAGNOSIS — D500 Iron deficiency anemia secondary to blood loss (chronic): Secondary | ICD-10-CM

## 2015-06-17 DIAGNOSIS — R519 Headache, unspecified: Secondary | ICD-10-CM

## 2015-06-17 DIAGNOSIS — Z7984 Long term (current) use of oral hypoglycemic drugs: Secondary | ICD-10-CM | POA: Insufficient documentation

## 2015-06-17 DIAGNOSIS — R000 Tachycardia, unspecified: Secondary | ICD-10-CM | POA: Insufficient documentation

## 2015-06-17 DIAGNOSIS — J45909 Unspecified asthma, uncomplicated: Secondary | ICD-10-CM | POA: Insufficient documentation

## 2015-06-17 DIAGNOSIS — R0602 Shortness of breath: Secondary | ICD-10-CM | POA: Insufficient documentation

## 2015-06-17 DIAGNOSIS — D649 Anemia, unspecified: Secondary | ICD-10-CM | POA: Diagnosis present

## 2015-06-17 DIAGNOSIS — G43909 Migraine, unspecified, not intractable, without status migrainosus: Secondary | ICD-10-CM | POA: Insufficient documentation

## 2015-06-17 DIAGNOSIS — R51 Headache: Secondary | ICD-10-CM

## 2015-06-17 DIAGNOSIS — I10 Essential (primary) hypertension: Secondary | ICD-10-CM | POA: Insufficient documentation

## 2015-06-17 DIAGNOSIS — R110 Nausea: Secondary | ICD-10-CM | POA: Insufficient documentation

## 2015-06-17 DIAGNOSIS — E876 Hypokalemia: Secondary | ICD-10-CM | POA: Insufficient documentation

## 2015-06-17 DIAGNOSIS — Z79899 Other long term (current) drug therapy: Secondary | ICD-10-CM | POA: Insufficient documentation

## 2015-06-17 HISTORY — DX: Anemia, unspecified: D64.9

## 2015-06-17 HISTORY — DX: Unspecified asthma, uncomplicated: J45.909

## 2015-06-17 LAB — CBC
HCT: 25 % — ABNORMAL LOW (ref 36.0–46.0)
Hemoglobin: 7.4 g/dL — ABNORMAL LOW (ref 12.0–15.0)
MCH: 19.7 pg — AB (ref 26.0–34.0)
MCHC: 29.6 g/dL — ABNORMAL LOW (ref 30.0–36.0)
MCV: 66.7 fL — AB (ref 78.0–100.0)
PLATELETS: 257 10*3/uL (ref 150–400)
RBC: 3.75 MIL/uL — AB (ref 3.87–5.11)
RDW: 27.2 % — ABNORMAL HIGH (ref 11.5–15.5)
WBC: 6.1 10*3/uL (ref 4.0–10.5)

## 2015-06-17 LAB — BASIC METABOLIC PANEL
Anion gap: 9 (ref 5–15)
BUN: 15 mg/dL (ref 6–20)
CALCIUM: 9.8 mg/dL (ref 8.9–10.3)
CO2: 24 mmol/L (ref 22–32)
CREATININE: 1.04 mg/dL — AB (ref 0.44–1.00)
Chloride: 107 mmol/L (ref 101–111)
GFR calc Af Amer: >60 mL/min (ref >60)
GLUCOSE: 117 mg/dL — AB (ref 65–99)
POTASSIUM: 3.4 mmol/L — AB (ref 3.5–5.1)
Sodium: 140 mmol/L (ref 135–145)

## 2015-06-17 LAB — BRAIN NATRIURETIC PEPTIDE: B NATRIURETIC PEPTIDE 5: 392.1 pg/mL — AB (ref 0.0–100.0)

## 2015-06-17 LAB — I-STAT TROPONIN, ED: Troponin i, poc: 0.02 ng/mL (ref 0.00–0.08)

## 2015-06-17 LAB — IRON AND TIBC
Iron: 12 ug/dL — ABNORMAL LOW (ref 28–170)
Saturation Ratios: 2 % — ABNORMAL LOW (ref 10.4–31.8)
TIBC: 504 ug/dL — ABNORMAL HIGH (ref 250–450)
UIBC: 492 ug/dL

## 2015-06-17 LAB — CBC WITH DIFFERENTIAL/PLATELET
BASOS PCT: 0 %
Basophils Absolute: 0 10*3/uL (ref 0.0–0.1)
EOS PCT: 2 %
Eosinophils Absolute: 0.1 10*3/uL (ref 0.0–0.7)
HCT: 21 % — ABNORMAL LOW (ref 36.0–46.0)
Hemoglobin: 5.6 g/dL — CL (ref 12.0–15.0)
LYMPHS ABS: 2.1 10*3/uL (ref 0.7–4.0)
LYMPHS PCT: 39 %
MCH: 16.4 pg — AB (ref 26.0–34.0)
MCHC: 26.7 g/dL — AB (ref 30.0–36.0)
MCV: 61.6 fL — AB (ref 78.0–100.0)
MONO ABS: 0.2 10*3/uL (ref 0.1–1.0)
Monocytes Relative: 4 %
NEUTROS ABS: 3.1 10*3/uL (ref 1.7–7.7)
NEUTROS PCT: 55 %
PLATELETS: 302 10*3/uL (ref 150–400)
RBC: 3.41 MIL/uL — ABNORMAL LOW (ref 3.87–5.11)
RDW: 24 % — AB (ref 11.5–15.5)
WBC: 5.5 10*3/uL (ref 4.0–10.5)

## 2015-06-17 LAB — FOLATE: Folate: 17 ng/mL (ref >5.9)

## 2015-06-17 LAB — VITAMIN B12: VITAMIN B 12: 325 pg/mL (ref 180–914)

## 2015-06-17 LAB — RETICULOCYTES
RBC.: 3.24 MIL/uL — AB (ref 3.87–5.11)
RETIC CT PCT: 1.8 % (ref 0.4–3.1)
Retic Count, Absolute: 58.3 10*3/uL (ref 19.0–186.0)

## 2015-06-17 LAB — POC OCCULT BLOOD, ED: FECAL OCCULT BLD: NEGATIVE

## 2015-06-17 LAB — PREPARE RBC (CROSSMATCH)

## 2015-06-17 LAB — FERRITIN: Ferritin: 4 ng/mL — ABNORMAL LOW (ref 11–307)

## 2015-06-17 MED ORDER — HYDROCODONE-ACETAMINOPHEN 5-325 MG PO TABS
1.0000 | ORAL_TABLET | ORAL | Status: DC | PRN
  Administered 2015-06-17 – 2015-06-19 (×5): 2 via ORAL
  Filled 2015-06-17 (×5): qty 2

## 2015-06-17 MED ORDER — SODIUM CHLORIDE 0.9 % IV SOLN: 250.0000 mL | INTRAVENOUS | Status: DC | PRN

## 2015-06-17 MED ORDER — IPRATROPIUM-ALBUTEROL 0.5-2.5 (3) MG/3ML IN SOLN
3.0000 mL | Freq: Once | RESPIRATORY_TRACT | Status: AC
  Administered 2015-06-17: 3 mL via RESPIRATORY_TRACT
  Filled 2015-06-17: qty 3

## 2015-06-17 MED ORDER — ONDANSETRON HCL 4 MG PO TABS
4.0000 mg | ORAL_TABLET | Freq: Four times a day (QID) | ORAL | Status: DC | PRN
  Administered 2015-06-17 – 2015-06-19 (×6): 4 mg via ORAL
  Filled 2015-06-17 (×6): qty 1

## 2015-06-17 MED ORDER — POTASSIUM CHLORIDE CRYS ER 20 MEQ PO TBCR
20.0000 meq | EXTENDED_RELEASE_TABLET | Freq: Once | ORAL | Status: AC
  Administered 2015-06-17: 20 meq via ORAL
  Filled 2015-06-17: qty 1

## 2015-06-17 MED ORDER — HYDRALAZINE HCL 20 MG/ML IJ SOLN
10.0000 mg | Freq: Four times a day (QID) | INTRAMUSCULAR | Status: DC | PRN
  Administered 2015-06-17 (×2): 10 mg via INTRAVENOUS
  Filled 2015-06-17 (×2): qty 1

## 2015-06-17 MED ORDER — SODIUM CHLORIDE 0.9 % IJ SOLN: 3.0000 mL | Freq: Two times a day (BID) | INTRAMUSCULAR | Status: DC

## 2015-06-17 MED ORDER — ONDANSETRON HCL 4 MG/2ML IJ SOLN: 4.0000 mg | Freq: Four times a day (QID) | INTRAMUSCULAR | Status: DC | PRN

## 2015-06-17 MED ORDER — MORPHINE SULFATE (PF) 2 MG/ML IV SOLN
1.0000 mg | INTRAVENOUS | Status: DC | PRN
  Administered 2015-06-17 – 2015-06-18 (×2): 1 mg via INTRAVENOUS
  Filled 2015-06-17 (×2): qty 1

## 2015-06-17 MED ORDER — PREDNISONE 20 MG PO TABS
60.0000 mg | ORAL_TABLET | Freq: Once | ORAL | Status: AC
  Administered 2015-06-17: 60 mg via ORAL
  Filled 2015-06-17: qty 3

## 2015-06-17 MED ORDER — SODIUM CHLORIDE 0.9 % IV SOLN
Freq: Once | INTRAVENOUS | Status: AC
  Administered 2015-06-17: 10:00:00 via INTRAVENOUS

## 2015-06-17 MED ORDER — PRAVASTATIN SODIUM 20 MG PO TABS
20.0000 mg | ORAL_TABLET | Freq: Every day | ORAL | Status: DC
  Administered 2015-06-17 – 2015-06-18 (×2): 20 mg via ORAL
  Filled 2015-06-17 (×3): qty 1

## 2015-06-17 MED ORDER — POTASSIUM CHLORIDE CRYS ER 20 MEQ PO TBCR
40.0000 meq | EXTENDED_RELEASE_TABLET | Freq: Once | ORAL | Status: AC
  Administered 2015-06-17: 40 meq via ORAL
  Filled 2015-06-17: qty 2

## 2015-06-17 MED ORDER — PANTOPRAZOLE SODIUM 40 MG PO TBEC
40.0000 mg | DELAYED_RELEASE_TABLET | Freq: Every day | ORAL | Status: DC
  Administered 2015-06-17 – 2015-06-19 (×3): 40 mg via ORAL
  Filled 2015-06-17 (×4): qty 1

## 2015-06-17 MED ORDER — ALUM & MAG HYDROXIDE-SIMETH 200-200-20 MG/5ML PO SUSP: 30.0000 mL | Freq: Four times a day (QID) | ORAL | Status: DC | PRN

## 2015-06-17 MED ORDER — FUROSEMIDE 10 MG/ML IJ SOLN
20.0000 mg | Freq: Once | INTRAMUSCULAR | Status: AC
  Administered 2015-06-17: 20 mg via INTRAVENOUS
  Filled 2015-06-17: qty 2

## 2015-06-17 MED ORDER — ALBUTEROL SULFATE (2.5 MG/3ML) 0.083% IN NEBU: 2.5000 mg | INHALATION_SOLUTION | RESPIRATORY_TRACT | Status: DC | PRN

## 2015-06-17 MED ORDER — AMLODIPINE BESYLATE 10 MG PO TABS
10.0000 mg | ORAL_TABLET | Freq: Every day | ORAL | Status: DC
  Administered 2015-06-17 – 2015-06-19 (×3): 10 mg via ORAL
  Filled 2015-06-17 (×3): qty 1

## 2015-06-17 MED ORDER — ACETAMINOPHEN 650 MG RE SUPP: 650.0000 mg | Freq: Four times a day (QID) | RECTAL | Status: DC | PRN

## 2015-06-17 MED ORDER — SODIUM CHLORIDE 0.9 % IV SOLN: INTRAVENOUS | Status: DC

## 2015-06-17 MED ORDER — ACETAMINOPHEN 325 MG PO TABS: 650.0000 mg | ORAL_TABLET | Freq: Four times a day (QID) | ORAL | Status: DC | PRN

## 2015-06-17 MED ORDER — SODIUM CHLORIDE 0.9 % IV SOLN
Freq: Once | INTRAVENOUS | Status: AC
  Administered 2015-06-17: 20:00:00 via INTRAVENOUS

## 2015-06-17 MED ORDER — SODIUM CHLORIDE 0.9 % IJ SOLN: 3.0000 mL | INTRAMUSCULAR | Status: DC | PRN

## 2015-06-17 MED ORDER — ALBUTEROL SULFATE (2.5 MG/3ML) 0.083% IN NEBU
5.0000 mg | INHALATION_SOLUTION | Freq: Once | RESPIRATORY_TRACT | Status: AC
Start: 2015-06-17 — End: 2015-06-17
  Administered 2015-06-17: 5 mg via RESPIRATORY_TRACT
  Filled 2015-06-17: qty 6

## 2015-06-17 NOTE — ED Notes (Signed)
Alaina unsuccessful IV attempt.

## 2015-06-17 NOTE — ED Notes (Signed)
Pt complains of feeling short of breath and congested foe two weeks, no relief from inhaler, hx of asthma

## 2015-06-17 NOTE — ED Provider Notes (Signed)
CSN: [id number]     Arrival date & time 06/17/15  0520 History   First MD Initiated Contact with Patient 06/17/15 [phone]     Chief Complaint  Patient presents with  . Asthma   Patient is a 44 y.o. female presenting with asthma.  Asthma     Renee Alexander is an 44 y.o. female with h/o asthma and uncontrolled HTN who presents to the ED for evaluation of SOB/asthma exacerbation. She states that at baseline she uses her rescue inhaler less than once a week. However over the past couple of weeks she has noticed increased SOB especially at night. She states that it has progressively gotten worse and she has been using her albuterol inhaler daily, sometimes more than once a day, with no relief. She states she has felt chest tightness and wheezing but denies respiratory distress or gasping for air. She states she has not been to the ED for an asthma exacerbation in "years" and has never been admitted to the hospital for an exacerbation. Denies smoking. She was given one albuterol neb prior to our interview and states she feels some improvement but continues to endorse some chest tightness.   Renee Alexander also complains of generalized weakness and fatigue that has developed over the past couple of weeks. Of note, she was seen in this ED two years ago for similar complaints and was found to be severely anemic and hypokalemic requiring admission for blood transfusions and K+ supplementation. She currently denies chest pain, fever, chills, N/V/D, abd pain. Denies bloody stools, syncope, or lightheadedness. Pt is hypertensive in the ED today but review of past records reveals this is chronic. She admits that she has not taken any of her bp medications for over a year due to cost.    Past Medical History  Diagnosis Date  . Hypertension   . Asthma    History reviewed. No pertinent past surgical history. History reviewed. No pertinent family history. Social History  Substance Use Topics  . Smoking status: Never  Smoker   . Smokeless tobacco: None  . Alcohol Use: No   OB History    No data available     Review of Systems  All other systems reviewed and are negative.     Allergies  Penicillins  Home Medications   Prior to Admission medications   Medication Sig Start Date End Date Taking? Authorizing Provider  omeprazole (PRILOSEC) 20 MG capsule Take 20 mg by mouth daily.   Yes Historical Provider, MD  amLODipine (NORVASC) 10 MG tablet Take 10 mg by mouth daily.    Historical Provider, MD  ferrous sulfate 325 (65 FE) MG tablet Take 1 tablet (325 mg total) by mouth 2 (two) times daily with a meal. Patient not taking: Reported on 06/17/2015 05/07/13   Orson Eva, MD  hydrochlorothiazide (HYDRODIURIL) 25 MG tablet Take 25 mg by mouth daily.    Historical Provider, MD  metFORMIN (GLUCOPHAGE) 500 MG tablet Take 500 mg by mouth 2 (two) times daily with a meal.    Historical Provider, MD  pravastatin (PRAVACHOL) 20 MG tablet Take 20 mg by mouth daily.    Historical Provider, MD   BP 187/75 mmHg  Pulse 105  Temp(Src) 98.7 F (37.1 C) (Oral)  Resp 20  Ht 5\' 4"  (1.626 m)  Wt 230 lb (104.327 kg)  BMI 39.46 kg/m2  SpO2 98%  LMP 05/27/2015 Physical Exam  Constitutional: She is oriented to person, place, and time. No distress.  HENT:  Right  Ear: External ear normal.  Left Ear: External ear normal.  Nose: Nose normal.  Mouth/Throat: Oropharynx is clear and moist. No oropharyngeal exudate.  Eyes: Conjunctivae and EOM are normal. Pupils are equal, round, and reactive to light.  Neck: Normal range of motion. Neck supple.  Cardiovascular: Regular rhythm, normal heart sounds and intact distal pulses.  Tachycardia present.   No murmur heard. Pulmonary/Chest: Effort normal and breath sounds normal. No accessory muscle usage or stridor. No respiratory distress. She has no wheezes. She exhibits no tenderness.  Lungs CTAB on my exam following one albuterol breathing treatment. Few scattered rhonchi  present.   Abdominal: Soft. Bowel sounds are normal. She exhibits no distension. There is no tenderness.  Musculoskeletal: Normal range of motion. She exhibits no edema.  Lymphadenopathy:    She has no cervical adenopathy.  Neurological: She is alert and oriented to person, place, and time. No cranial nerve deficit.  Skin: Skin is warm and dry. She is not diaphoretic.  Nursing note and vitals reviewed.   ED Course  Procedures (including critical care time) Labs Review Labs Reviewed  CBC WITH DIFFERENTIAL/PLATELET - Abnormal; Notable for the following:    RBC 3.41 (*)    Hemoglobin 5.6 (*)    HCT 21.0 (*)    MCV 61.6 (*)    MCH 16.4 (*)    MCHC 26.7 (*)    RDW 24.0 (*)    All other components within normal limits  BASIC METABOLIC PANEL - Abnormal; Notable for the following:    Potassium 3.4 (*)    Glucose, Bld 117 (*)    Creatinine, Ser 1.04 (*)    All other components within normal limits  BRAIN NATRIURETIC PEPTIDE - Abnormal; Notable for the following:    B Natriuretic Peptide 392.1 (*)    All other components within normal limits  VITAMIN B12  FOLATE  IRON AND TIBC  FERRITIN  RETICULOCYTES  OCCULT BLOOD X 1 CARD TO LAB, STOOL  I-STAT TROPOININ, ED  POC OCCULT BLOOD, ED  TYPE AND SCREEN  PREPARE RBC (CROSSMATCH)    Imaging Review Dg Chest 2 View  06/17/2015  CLINICAL DATA:  New onset shortness of breath beginning at 0200 hours today. Difficulty breathing for 2 weeks. History of asthma. EXAM: CHEST  2 VIEW COMPARISON:  Chest radiograph May 06, 2013 FINDINGS: The cardiac silhouette appears moderately enlarged, increased from prior examination. Pulmonary vascular congestion. Small LEFT pleural effusion with patchy LEFT lower lobe airspace opacity. No pneumothorax. Soft tissue planes and included osseous structures are nonsuspicious. IMPRESSION: Moderate cardiomegaly, increased from prior examination findings of pulmonary vascular congestion. Small LEFT pleural  effusion and probable atelectasis. Electronically Signed   By: Elon Alas M.D.   On: 06/17/2015 06:51   I have personally reviewed and evaluated these images and lab results as part of my medical decision-making.   EKG Interpretation None      MDM   Final diagnoses:  Anemia, unspecified anemia type  Shortness of breath    6:30 am Some improvement following initial albuterol neb tx but continues to endorse chest tightness, though i do not hear wheezing on my exam and pt does not look like she is working hard to breathe. Will give duoneb and prednisone now. Regarding pt's weakness and fatigue, I am concerned about possible anemia given her prior presentation. Will check CBC and BMP. Given her uncontrolled HTN and weakness, will also check EKG, trop, and CXR. Discussed w/ pt she will need to f/u with  PCP for HTN management. She is also tachycardic but this appears to be her baseline as well.   8:00 am Hgb 5.6. Pt will need to be admitted for further management of anemia. Will start process for 2U PRBC transfusion here in the ED. T&S ordered as well as an anemia panel. Will check BNP as well as CXR shows increased cardiomegaly and increased pulmonary vascular congestion from prior studies. Upon further questioning, pt reveals that she has had progressively heavier menstrual periods over the last 2 years. She states the first day is light but days 2 & 3 require pad AND tampon to be used together and changed q2h. Her cycle is otherwise regular and lasts 5 days on a 28 day cycle.   Spoke to Dr. Tyrell Antonio, will admit pt to tele.   Anne Ng, PA-C 06/17/15 [id number]  Courteney Julio Alm, MD 06/18/15 [id number]

## 2015-06-17 NOTE — Care Management Note (Signed)
Case Management Note  Patient Details  Name: Renee Alexander MRN: [id number] Date of Birth: 04-18-1971  Subjective/Objective:  44 y/o f admitted w/Anemia. 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 She states she did have medicaid, but once she started working it was taken away. Provided  Patient w/free GYN clinic resources, $4 [employer] med list. Most of patient's meds are on the [employer] med list(patient states she can afford), Provided w/health insurance list, & community resources.                 Action/Plan:d/c plan home.   Expected Discharge Date:   (UNKNOWN)               Expected Discharge Plan:  Home/Self Care  In-House Referral:     Discharge planning Services  CM Consult, Medication Assistance, [facility]  Post Acute Care Choice:    Choice offered to:     DME Arranged:    DME Agency:     HH Arranged:    HH Agency:     Status of Service:  In process, will continue to follow  Medicare Important Message Given:    Date Medicare IM Given:    Medicare IM give by:    Date Additional Medicare IM Given:    Additional Medicare Important Message give by:     If discussed at [place] of Stay Meetings, dates discussed:    Additional Comments:  Dessa Phi, RN 06/17/2015, 4:11 PM

## 2015-06-17 NOTE — ED Notes (Signed)
Patient transported to X-ray 

## 2015-06-17 NOTE — H&P (Signed)
[facility] History and Physical  Kimberlly S. Sherral Hammers [id number] DOB: 1971/05/12 DOA: 06/17/2015  Referring physician: ED physician PCP: No primary care provider on file.   Chief Complaint: SOB  HPI: Renee Alexander is a 44 y.o. female with PMH significant for HTN, anemia asthma who presents with increasing SOB.  Patient relates dyspnea on exertion for last couples of weeks worse for last few days. She denies melena, hematochezia. She does relates menorrhagia for last 2 years. She has not been able to take her BP medications or iron, she can not afford it. She denies chest pain. If she doesn't move she is not SOB. She denies abdominal pain.    Review of Systems:  Negative except as per HPI  Past Medical History  Diagnosis Date  . Hypertension   . Asthma   . Anemia    History reviewed. No pertinent past surgical history. Social History:  reports that she has never smoked. She does not have any smokeless tobacco history on file. She reports that she does not drink alcohol. Her drug history is not on file.  Allergies; penicillin.   Family history; Mother; asthma, father HTN, Diabetes.   Prior to Admission medications   Medication Sig Start Date End Date Taking? Authorizing Provider  omeprazole (PRILOSEC) 20 MG capsule Take 20 mg by mouth daily.   Yes Historical Provider, MD  amLODipine (NORVASC) 10 MG tablet Take 10 mg by mouth daily.    Historical Provider, MD  ferrous sulfate 325 (65 FE) MG tablet Take 1 tablet (325 mg total) by mouth 2 (two) times daily with a meal. Patient not taking: Reported on 06/17/2015 05/07/13   Orson Eva, MD  hydrochlorothiazide (HYDRODIURIL) 25 MG tablet Take 25 mg by mouth daily.    Historical Provider, MD  metFORMIN (GLUCOPHAGE) 500 MG tablet Take 500 mg by mouth 2 (two) times daily with a meal.    Historical Provider, MD  pravastatin (PRAVACHOL) 20 MG tablet Take 20 mg by mouth daily.    Historical Provider, MD   Physical Exam: Filed  Vitals:   06/17/15 1011 06/17/15 1236 06/17/15 1305 06/17/15 1455  BP: 197/86 176/80 176/77 178/85  Pulse: 106 107 106 109  Temp: 98.9 F (37.2 C) 98.7 F (37.1 C) 98.4 F (36.9 C) 98.3 F (36.8 C)  TempSrc: Oral Oral Oral Oral  Resp: 22 20 22 20   Height:      Weight:      SpO2: 100% 100% 100% 100%    Wt Readings from Last 3 Encounters:  06/17/15 99.474 kg (219 lb 4.8 oz)  05/07/13 101.1 kg (222 lb 14.2 oz)    General:  Appears calm and comfortable Eyes: PERRL, normal lids, irises & conjunctiva ENT: grossly normal hearing, lips & tongue Neck: no LAD, masses or thyromegaly Cardiovascular: RRR, no m/r/g. No LE edema. Telemetry: SR, no arrhythmias  Respiratory: CTA bilaterally, no w/r/r. Normal respiratory effort. Abdomen: soft, ntnd Skin: no rash or induration seen on limited exam Musculoskeletal: grossly normal tone BUE/BLE Psychiatric: grossly normal mood and affect, speech fluent and appropriate Neurologic: grossly non-focal.          Labs on Admission:  Basic Metabolic Panel:  Recent Labs Lab 06/17/15 0649  NA 140  K 3.4*  CL 107  CO2 24  GLUCOSE 117*  BUN 15  CREATININE 1.04*  CALCIUM 9.8   Liver Function Tests: No results for input(s): AST, ALT, ALKPHOS, BILITOT, PROT, ALBUMIN in the last 168 hours. No results for input(s): LIPASE,  AMYLASE in the last 168 hours. No results for input(s): AMMONIA in the last 168 hours. CBC:  Recent Labs Lab 06/17/15 0649  WBC 5.5  NEUTROABS 3.1  HGB 5.6*  HCT 21.0*  MCV 61.6*  PLT 302   Cardiac Enzymes: No results for input(s): CKTOTAL, CKMB, CKMBINDEX, TROPONINI in the last 168 hours.  BNP (last 3 results)  Recent Labs  06/17/15 0649  BNP 392.1*    ProBNP (last 3 results) No results for input(s): PROBNP in the last 8760 hours.  CBG: No results for input(s): GLUCAP in the last 168 hours.  Radiological Exams on Admission: Dg Chest 2 View  06/17/2015  CLINICAL DATA:  New onset shortness of breath  beginning at 0200 hours today. Difficulty breathing for 2 weeks. History of asthma. EXAM: CHEST  2 VIEW COMPARISON:  Chest radiograph May 06, 2013 FINDINGS: The cardiac silhouette appears moderately enlarged, increased from prior examination. Pulmonary vascular congestion. Small LEFT pleural effusion with patchy LEFT lower lobe airspace opacity. No pneumothorax. Soft tissue planes and included osseous structures are nonsuspicious. IMPRESSION: Moderate cardiomegaly, increased from prior examination findings of pulmonary vascular congestion. Small LEFT pleural effusion and probable atelectasis. Electronically Signed   By: Elon Alas M.D.   On: 06/17/2015 06:51    EKG: Independently reviewed. Sinus tachycardia  Assessment/Plan Active Problems:   Hypokalemia   Iron deficiency anemia   Anemia   Shortness of breath   HTN (hypertension)  1-Severe Anemia, iron deficiency in setting of menorrhagia; Getting 2 units of PRBC. Suspect she will need more transfusion.  Will need iron at discharge.  Needs to follow up with GYN.  Occult blood negative.   2-HTN; resume Norvasc. Will order PRN hydralazine. Hold HCTZ due to mildly increase in cr.   3-Dyspnea; likely related to severe anemia/  4-hypokalemia; replete orally.   Code Status: Full code.  DVT Prophylaxis:scd.  Family Communication: care discussed with patient.  Disposition Plan: expect 2 days or less inpatient.   Time spent: 75 minutes.   Niel Hummer A [facility] Pager [phone]

## 2015-06-17 NOTE — ED Notes (Addendum)
Two unsuccessful IV attempt by this rn. Providers at bedside; Alaina reports will attempt IV/blood draw when exam complete.

## 2015-06-18 ENCOUNTER — Observation Stay (HOSPITAL_COMMUNITY): Payer: Medicaid Other

## 2015-06-18 DIAGNOSIS — D509 Iron deficiency anemia, unspecified: Secondary | ICD-10-CM

## 2015-06-18 DIAGNOSIS — R0602 Shortness of breath: Secondary | ICD-10-CM

## 2015-06-18 LAB — CBC
HCT: 28.5 % — ABNORMAL LOW (ref 36.0–46.0)
Hemoglobin: 8.6 g/dL — ABNORMAL LOW (ref 12.0–15.0)
MCH: 20.8 pg — ABNORMAL LOW (ref 26.0–34.0)
MCHC: 30.2 g/dL (ref 30.0–36.0)
MCV: 69 fL — AB (ref 78.0–100.0)
PLATELETS: 251 10*3/uL (ref 150–400)
RBC: 4.13 MIL/uL (ref 3.87–5.11)
RDW: 26.6 % — AB (ref 11.5–15.5)
WBC: 8.6 10*3/uL (ref 4.0–10.5)

## 2015-06-18 LAB — BASIC METABOLIC PANEL
ANION GAP: 11 (ref 5–15)
BUN: 17 mg/dL (ref 6–20)
CALCIUM: 10.1 mg/dL (ref 8.9–10.3)
CO2: 24 mmol/L (ref 22–32)
Chloride: 107 mmol/L (ref 101–111)
Creatinine, Ser: 1.06 mg/dL — ABNORMAL HIGH (ref 0.44–1.00)
GFR calc Af Amer: >60 mL/min (ref >60)
GLUCOSE: 97 mg/dL (ref 65–99)
POTASSIUM: 3.7 mmol/L (ref 3.5–5.1)
SODIUM: 142 mmol/L (ref 135–145)

## 2015-06-18 MED ORDER — HYDRALAZINE HCL 25 MG PO TABS
25.0000 mg | ORAL_TABLET | Freq: Two times a day (BID) | ORAL | Status: DC
  Administered 2015-06-18 – 2015-06-19 (×3): 25 mg via ORAL
  Filled 2015-06-18 (×3): qty 1

## 2015-06-18 MED ORDER — TRAMADOL HCL 50 MG PO TABS
50.0000 mg | ORAL_TABLET | Freq: Three times a day (TID) | ORAL | Status: DC | PRN
  Administered 2015-06-18 – 2015-06-19 (×2): 50 mg via ORAL
  Filled 2015-06-18 (×2): qty 1

## 2015-06-18 MED ORDER — ONDANSETRON HCL 4 MG/2ML IJ SOLN
4.0000 mg | Freq: Once | INTRAMUSCULAR | Status: AC
  Administered 2015-06-18: 4 mg via INTRAVENOUS
  Filled 2015-06-18: qty 2

## 2015-06-18 MED ORDER — VITAMIN B-12 100 MCG PO TABS
100.0000 ug | ORAL_TABLET | Freq: Every day | ORAL | Status: DC
  Administered 2015-06-18 – 2015-06-19 (×2): 100 ug via ORAL
  Filled 2015-06-18 (×3): qty 1

## 2015-06-18 NOTE — Progress Notes (Signed)
[facility] PROGRESS NOTE  Renee Alexander [id number] DOB: [birth year] DOA: 06/17/2015 PCP: No primary care provider on file.  Assessment/Plan: 1-Anemia;  Improved after blood transfusion.  Hb today at 8.6. Will need to be discharge on iron.  Needs to follow up with GYN.  She is breathing better, No SOB on exertion.   2-HTN, uncontrolled:  Norvasc. Start hydralazine 25 mg BID.   3-Headaches: control BP. Will also check CT head   Code Status: Full code.  Family Communication: care discussed with patient.  Disposition Plan: to be determine   Consultants:  none  Procedures:  none  Antibiotics:  none  HPI/Subjective: Complaining of headaches, frontal and occipital area.   Objective: Filed Vitals:   06/18/15 1038  BP: 167/85  Pulse:   Temp:   Resp:     Intake/Output Summary (Last 24 hours) at 06/18/15 1318 Last data filed at 06/18/15 0900  Gross per 24 hour  Intake 916.75 ml  Output   3050 ml  Net -2133.25 ml   Filed Weights   06/17/15 0530 06/17/15 0936  Weight: 104.327 kg (230 lb) 99.474 kg (219 lb 4.8 oz)    Exam:   General:  Alert in no distress  Cardiovascular: S 1, S 2 RRR  Respiratory: CTA  Abdomen: BS present, soft   Musculoskeletal: no edema  Neuro non focal.   Data Reviewed: Basic Metabolic Panel:  Recent Labs Lab 06/17/15 0649 06/18/15 0551  NA 140 142  K 3.4* 3.7  CL 107 107  CO2 24 24  GLUCOSE 117* 97  BUN 15 17  CREATININE 1.04* 1.06*  CALCIUM 9.8 10.1   Liver Function Tests: No results for input(s): AST, ALT, ALKPHOS, BILITOT, PROT, ALBUMIN in the last 168 hours. No results for input(s): LIPASE, AMYLASE in the last 168 hours. No results for input(s): AMMONIA in the last 168 hours. CBC:  Recent Labs Lab 06/17/15 0649 06/17/15 1728 06/18/15 0551  WBC 5.5 6.1 8.6  NEUTROABS 3.1  --   --   HGB 5.6* 7.4* 8.6*  HCT 21.0* 25.0* 28.5*  MCV 61.6* 66.7* 69.0*  PLT 302 257 251   Cardiac Enzymes: No  results for input(s): CKTOTAL, CKMB, CKMBINDEX, TROPONINI in the last 168 hours. BNP (last 3 results)  Recent Labs  06/17/15 0649  BNP 392.1*    ProBNP (last 3 results) No results for input(s): PROBNP in the last 8760 hours.  CBG: No results for input(s): GLUCAP in the last 168 hours.  No results found for this or any previous visit (from the past 240 hour(s)).   Studies: Dg Chest 2 View  06/17/2015  CLINICAL DATA:  New onset shortness of breath beginning at 0200 hours today. Difficulty breathing for 2 weeks. History of asthma. EXAM: CHEST  2 VIEW COMPARISON:  Chest radiograph May 06, 2013 FINDINGS: The cardiac silhouette appears moderately enlarged, increased from prior examination. Pulmonary vascular congestion. Small LEFT pleural effusion with patchy LEFT lower lobe airspace opacity. No pneumothorax. Soft tissue planes and included osseous structures are nonsuspicious. IMPRESSION: Moderate cardiomegaly, increased from prior examination findings of pulmonary vascular congestion. Small LEFT pleural effusion and probable atelectasis. Electronically Signed   By: Elon Alas M.D.   On: 06/17/2015 06:51    Scheduled Meds: . amLODipine  10 mg Oral Daily  . hydrALAZINE  25 mg Oral BID  . pantoprazole  40 mg Oral Daily  . pravastatin  20 mg Oral Daily  . sodium chloride  3 mL Intravenous Q12H  .  vitamin B-12  100 mcg Oral Daily   Continuous Infusions:   Active Problems:   Hypokalemia   Iron deficiency anemia   Anemia   Shortness of breath   HTN (hypertension)    Time spent: 35 minutes    Renee Alexander, [facility] Pager [phone]. If 7PM-7AM, please contact night-coverage at [email or web address], password [facility] 06/18/2015, 1:18 PM

## 2015-06-18 NOTE — Progress Notes (Signed)
After patient ate breakfast, she had one episide of emesis. States she is no longer feeling nauseous. Will continue to monitor.

## 2015-06-19 DIAGNOSIS — E876 Hypokalemia: Secondary | ICD-10-CM

## 2015-06-19 MED ORDER — AMLODIPINE BESYLATE 10 MG PO TABS: 10.0000 mg | ORAL_TABLET | Freq: Every day | ORAL | Status: AC

## 2015-06-19 MED ORDER — FERROUS SULFATE 325 (65 FE) MG PO TABS: 325.0000 mg | ORAL_TABLET | Freq: Three times a day (TID) | ORAL | Status: DC

## 2015-06-19 MED ORDER — POLYETHYLENE GLYCOL 3350 17 G PO PACK: 17.0000 g | PACK | Freq: Two times a day (BID) | ORAL | Status: DC

## 2015-06-19 MED ORDER — ALBUTEROL SULFATE HFA 108 (90 BASE) MCG/ACT IN AERS: 2.0000 | INHALATION_SPRAY | Freq: Four times a day (QID) | RESPIRATORY_TRACT | Status: AC | PRN

## 2015-06-19 MED ORDER — METFORMIN HCL 500 MG PO TABS: 500.0000 mg | ORAL_TABLET | Freq: Two times a day (BID) | ORAL | Status: DC

## 2015-06-19 MED ORDER — TRAMADOL HCL 50 MG PO TABS: 50.0000 mg | ORAL_TABLET | Freq: Three times a day (TID) | ORAL | Status: DC | PRN

## 2015-06-19 MED ORDER — ONDANSETRON HCL 4 MG PO TABS: 4.0000 mg | ORAL_TABLET | Freq: Four times a day (QID) | ORAL | Status: DC | PRN

## 2015-06-19 MED ORDER — CYANOCOBALAMIN 100 MCG PO TABS: 100.0000 ug | ORAL_TABLET | Freq: Every day | ORAL | Status: DC

## 2015-06-19 MED ORDER — HYDRALAZINE HCL 25 MG PO TABS: 25.0000 mg | ORAL_TABLET | Freq: Two times a day (BID) | ORAL | Status: DC

## 2015-06-19 MED ORDER — POLYETHYLENE GLYCOL 3350 17 G PO PACK
17.0000 g | PACK | Freq: Two times a day (BID) | ORAL | Status: DC
Start: 2015-06-19 — End: 2015-06-19
  Administered 2015-06-19: 17 g via ORAL
  Filled 2015-06-19: qty 1

## 2015-06-19 NOTE — Discharge Summary (Signed)
Physician Discharge Summary  Renee S. Renee Alexander LSL:[id number] DOB: 1970-10-26 DOA: 06/17/2015  PCP: No primary care provider on file.  Admit date: 06/17/2015 Discharge date: 06/19/2015  Time spent: 35 minutes  Recommendations for Outpatient Follow-up:  Needs referral to GYN Needs CBC to follow hb Needs b-met to follow renal function.   Discharge Diagnoses:    Iron deficiency anemia   Hypokalemia   Anemia   Shortness of breath   HTN (hypertension)   Headaches, migraine  Discharge Condition: stable  Diet recommendation: heart healthy  Filed Weights   06/17/15 0530 06/17/15 0936  Weight: 104.327 kg (230 lb) 99.474 kg (219 lb 4.8 oz)    History of present illness:  HPI: Renee Alexander is a 44 y.o. female with PMH significant for HTN, anemia asthma who presents with increasing SOB. Patient relates dyspnea on exertion for last couples of weeks worse for last few days. She denies melena, hematochezia. She does relates menorrhagia for last 2 years. She has not been able to take her BP medications or iron, she can not afford it. She denies chest pain. If she doesn't move she is not SOB. She denies abdominal pain.   Hospital Course:  1-Anemia;  Improved after blood transfusion.  Hb today at 8.6. Will need to be discharge on iron.  Needs to follow up with GYN.  She is breathing better, No SOB on exertion.  Will provide iron supplement and B 12 supplement.   2-HTN, uncontrolled:  Norvasc. Start hydralazine 25 mg BID.  Better controlled.   3-Headaches: nausea. Improved.  control BP. CT head negative.   Procedures:  none  Consultations:  none  Discharge Exam: Filed Vitals:   06/19/15 0444  BP: 144/72  Pulse: 80  Temp: 98.3 F (36.8 C)  Resp: 20    General: Alert in no distress Cardiovascular: S 1, S 2 RRR Respiratory: CTA  Discharge Instructions   Discharge Instructions    Diet - low sodium heart healthy    Complete by:  As directed      Increase  activity slowly    Complete by:  As directed           Current Discharge Medication List    START taking these medications   Details  hydrALAZINE (APRESOLINE) 25 MG tablet Take 1 tablet (25 mg total) by mouth 2 (two) times daily. Qty: 60 tablet, Refills: 0    ondansetron (ZOFRAN) 4 MG tablet Take 1 tablet (4 mg total) by mouth every 6 (six) hours as needed for nausea. Qty: 20 tablet, Refills: 0    polyethylene glycol (MIRALAX / GLYCOLAX) packet Take 17 g by mouth 2 (two) times daily. Qty: 14 each, Refills: 0    traMADol (ULTRAM) 50 MG tablet Take 1 tablet (50 mg total) by mouth every 8 (eight) hours as needed for moderate pain. Qty: 30 tablet, Refills: 0    vitamin B-12 100 MCG tablet Take 1 tablet (100 mcg total) by mouth daily. Qty: 10 tablet, Refills: 0      CONTINUE these medications which have CHANGED   Details  amLODipine (NORVASC) 10 MG tablet Take 1 tablet (10 mg total) by mouth daily. Qty: 30 tablet, Refills: 0    ferrous sulfate 325 (65 FE) MG tablet Take 1 tablet (325 mg total) by mouth 3 (three) times daily with meals. Qty: 90 tablet, Refills: 3      CONTINUE these medications which have NOT CHANGED   Details  omeprazole (PRILOSEC) 20 MG capsule Take  20 mg by mouth daily.    metFORMIN (GLUCOPHAGE) 500 MG tablet Take 500 mg by mouth 2 (two) times daily with a meal.    pravastatin (PRAVACHOL) 20 MG tablet Take 20 mg by mouth daily.      STOP taking these medications     hydrochlorothiazide (HYDRODIURIL) 25 MG tablet        Allergies  Allergen Reactions  . Penicillins Other (See Comments)      The results of significant diagnostics from this hospitalization (including imaging, microbiology, ancillary and laboratory) are listed below for reference.    Significant Diagnostic Studies: Dg Chest 2 View  06/17/2015  CLINICAL DATA:  New onset shortness of breath beginning at 0200 hours today. Difficulty breathing for 2 weeks. History of asthma. EXAM:  CHEST  2 VIEW COMPARISON:  Chest radiograph May 06, 2013 FINDINGS: The cardiac silhouette appears moderately enlarged, increased from prior examination. Pulmonary vascular congestion. Small LEFT pleural effusion with patchy LEFT lower lobe airspace opacity. No pneumothorax. Soft tissue planes and included osseous structures are nonsuspicious. IMPRESSION: Moderate cardiomegaly, increased from prior examination findings of pulmonary vascular congestion. Small LEFT pleural effusion and probable atelectasis. Electronically Signed   By: Elon Alas M.D.   On: 06/17/2015 06:51   Ct Head Wo Contrast  06/18/2015  CLINICAL DATA:  Progressive headache since admission, nausea EXAM: CT HEAD WITHOUT CONTRAST TECHNIQUE: Contiguous axial images were obtained from the base of the skull through the vertex without intravenous contrast. COMPARISON:  07/27/2010 FINDINGS: No evidence of parenchymal hemorrhage or extra-axial fluid collection. No mass lesion, mass effect, or midline shift. No CT evidence of acute infarction. Mild subcortical white matter and periventricular small vessel ischemic changes. Cerebral volume is within normal limits.  No ventriculomegaly. The visualized paranasal sinuses are essentially clear. The mastoid air cells are unopacified. No evidence of calvarial fracture. IMPRESSION: No evidence of acute intracranial abnormality. Mild small vessel ischemic changes. Electronically Signed   By: Julian Hy M.D.   On: 06/18/2015 17:24    Microbiology: No results found for this or any previous visit (from the past 240 hour(s)).   Labs: Basic Metabolic Panel:  Recent Labs Lab 06/17/15 0649 06/18/15 0551  NA 140 142  K 3.4* 3.7  CL 107 107  CO2 24 24  GLUCOSE 117* 97  BUN 15 17  CREATININE 1.04* 1.06*  CALCIUM 9.8 10.1   Liver Function Tests: No results for input(s): AST, ALT, ALKPHOS, BILITOT, PROT, ALBUMIN in the last 168 hours. No results for input(s): LIPASE, AMYLASE in the  last 168 hours. No results for input(s): AMMONIA in the last 168 hours. CBC:  Recent Labs Lab 06/17/15 0649 06/17/15 1728 06/18/15 0551  WBC 5.5 6.1 8.6  NEUTROABS 3.1  --   --   HGB 5.6* 7.4* 8.6*  HCT 21.0* 25.0* 28.5*  MCV 61.6* 66.7* 69.0*  PLT 302 257 251   Cardiac Enzymes: No results for input(s): CKTOTAL, CKMB, CKMBINDEX, TROPONINI in the last 168 hours. BNP: BNP (last 3 results)  Recent Labs  06/17/15 0649  BNP 392.1*    ProBNP (last 3 results) No results for input(s): PROBNP in the last 8760 hours.  CBG: No results for input(s): GLUCAP in the last 168 hours.     SignedNiel Hummer A  [facility] 06/19/2015, 8:09 AM

## 2015-06-20 LAB — TYPE AND SCREEN
ABO/RH(D): AB POS
Antibody Screen: NEGATIVE
UNIT DIVISION: 0
UNIT DIVISION: 0
Unit division: 0

## 2017-03-08 ENCOUNTER — Encounter (HOSPITAL_COMMUNITY): Payer: Self-pay | Admitting: *Deleted

## 2017-03-08 ENCOUNTER — Observation Stay (HOSPITAL_COMMUNITY)
Admission: EM | Admit: 2017-03-08 | Discharge: 2017-03-09 | Disposition: A | Discharge status: Home / Self Care | Payer: 59 | Attending: Internal Medicine | Admitting: Internal Medicine

## 2017-03-08 ENCOUNTER — Emergency Department (HOSPITAL_COMMUNITY): Payer: 59

## 2017-03-08 DIAGNOSIS — D500 Iron deficiency anemia secondary to blood loss (chronic): Secondary | ICD-10-CM | POA: Diagnosis not present

## 2017-03-08 DIAGNOSIS — Z9114 Patient's other noncompliance with medication regimen: Secondary | ICD-10-CM | POA: Diagnosis not present

## 2017-03-08 DIAGNOSIS — I10 Essential (primary) hypertension: Secondary | ICD-10-CM | POA: Diagnosis not present

## 2017-03-08 DIAGNOSIS — J45909 Unspecified asthma, uncomplicated: Secondary | ICD-10-CM | POA: Diagnosis not present

## 2017-03-08 DIAGNOSIS — Z7984 Long term (current) use of oral hypoglycemic drugs: Secondary | ICD-10-CM | POA: Insufficient documentation

## 2017-03-08 DIAGNOSIS — Z880 Allergy status to penicillin: Secondary | ICD-10-CM | POA: Insufficient documentation

## 2017-03-08 DIAGNOSIS — N179 Acute kidney failure, unspecified: Secondary | ICD-10-CM | POA: Insufficient documentation

## 2017-03-08 DIAGNOSIS — E875 Hyperkalemia: Secondary | ICD-10-CM | POA: Insufficient documentation

## 2017-03-08 DIAGNOSIS — E876 Hypokalemia: Secondary | ICD-10-CM | POA: Insufficient documentation

## 2017-03-08 DIAGNOSIS — E118 Type 2 diabetes mellitus with unspecified complications: Secondary | ICD-10-CM | POA: Insufficient documentation

## 2017-03-08 DIAGNOSIS — I160 Hypertensive urgency: Secondary | ICD-10-CM | POA: Diagnosis present

## 2017-03-08 DIAGNOSIS — D649 Anemia, unspecified: Secondary | ICD-10-CM

## 2017-03-08 DIAGNOSIS — D509 Iron deficiency anemia, unspecified: Secondary | ICD-10-CM | POA: Diagnosis not present

## 2017-03-08 DIAGNOSIS — Z833 Family history of diabetes mellitus: Secondary | ICD-10-CM | POA: Diagnosis not present

## 2017-03-08 DIAGNOSIS — Z8379 Family history of other diseases of the digestive system: Secondary | ICD-10-CM | POA: Diagnosis not present

## 2017-03-08 DIAGNOSIS — I951 Orthostatic hypotension: Secondary | ICD-10-CM | POA: Diagnosis not present

## 2017-03-08 DIAGNOSIS — Z79899 Other long term (current) drug therapy: Secondary | ICD-10-CM | POA: Diagnosis not present

## 2017-03-08 DIAGNOSIS — I119 Hypertensive heart disease without heart failure: Secondary | ICD-10-CM | POA: Insufficient documentation

## 2017-03-08 DIAGNOSIS — G44209 Tension-type headache, unspecified, not intractable: Secondary | ICD-10-CM | POA: Diagnosis not present

## 2017-03-08 DIAGNOSIS — Z8489 Family history of other specified conditions: Secondary | ICD-10-CM | POA: Diagnosis not present

## 2017-03-08 DIAGNOSIS — E119 Type 2 diabetes mellitus without complications: Secondary | ICD-10-CM | POA: Diagnosis not present

## 2017-03-08 HISTORY — DX: Type 2 diabetes mellitus without complications: E11.9

## 2017-03-08 LAB — BASIC METABOLIC PANEL
Anion gap: 10 (ref 5–15)
BUN: 9 mg/dL (ref 6–20)
CO2: 24 mmol/L (ref 22–32)
CREATININE: 1.08 mg/dL — AB (ref 0.44–1.00)
Calcium: 9.2 mg/dL (ref 8.9–10.3)
Chloride: 106 mmol/L (ref 101–111)
GFR calc non Af Amer: >60 mL/min (ref >60)
GLUCOSE: 94 mg/dL (ref 65–99)
Potassium: 3 mmol/L — ABNORMAL LOW (ref 3.5–5.1)
Sodium: 140 mmol/L (ref 135–145)

## 2017-03-08 LAB — URINALYSIS, ROUTINE W REFLEX MICROSCOPIC
Bilirubin Urine: NEGATIVE
GLUCOSE, UA: NEGATIVE mg/dL
Ketones, ur: NEGATIVE mg/dL
Leukocytes, UA: NEGATIVE
Nitrite: NEGATIVE
Protein, ur: >=300 mg/dL — AB
SPECIFIC GRAVITY, URINE: 1.004 — AB (ref 1.005–1.030)
pH: 8 (ref 5.0–8.0)

## 2017-03-08 LAB — MAGNESIUM: Magnesium: 1.6 mg/dL — ABNORMAL LOW (ref 1.7–2.4)

## 2017-03-08 LAB — CBC
HCT: 26.9 % — ABNORMAL LOW (ref 36.0–46.0)
Hemoglobin: 7.5 g/dL — ABNORMAL LOW (ref 12.0–15.0)
MCH: 18.4 pg — AB (ref 26.0–34.0)
MCHC: 27.9 g/dL — AB (ref 30.0–36.0)
MCV: 66.1 fL — ABNORMAL LOW (ref 78.0–100.0)
PLATELETS: 297 10*3/uL (ref 150–400)
RBC: 4.07 MIL/uL (ref 3.87–5.11)
RDW: 21.8 % — ABNORMAL HIGH (ref 11.5–15.5)
WBC: 7.1 10*3/uL (ref 4.0–10.5)

## 2017-03-08 LAB — I-STAT BETA HCG BLOOD, ED (MC, WL, AP ONLY)

## 2017-03-08 LAB — I-STAT TROPONIN, ED: Troponin i, poc: 0.03 ng/mL (ref 0.00–0.08)

## 2017-03-08 LAB — CBG MONITORING, ED: Glucose-Capillary: 86 mg/dL (ref 65–99)

## 2017-03-08 MED ORDER — AMLODIPINE BESYLATE 10 MG PO TABS
10.0000 mg | ORAL_TABLET | Freq: Every day | ORAL | Status: DC
  Administered 2017-03-08 – 2017-03-09 (×2): 10 mg via ORAL
  Filled 2017-03-08 (×2): qty 1

## 2017-03-08 MED ORDER — SODIUM CHLORIDE 0.9 % IV SOLN
Freq: Once | INTRAVENOUS | Status: AC
  Administered 2017-03-08: 17:00:00 via INTRAVENOUS

## 2017-03-08 MED ORDER — CLONIDINE HCL 0.1 MG PO TABS
0.2000 mg | ORAL_TABLET | Freq: Once | ORAL | Status: AC
  Administered 2017-03-08: 0.2 mg via ORAL
  Filled 2017-03-08: qty 2

## 2017-03-08 MED ORDER — SODIUM CHLORIDE 0.9 % IV BOLUS (SEPSIS)
1000.0000 mL | Freq: Once | INTRAVENOUS | Status: AC
  Administered 2017-03-08: 1000 mL via INTRAVENOUS

## 2017-03-08 MED ORDER — ONDANSETRON HCL 4 MG/2ML IJ SOLN
4.0000 mg | Freq: Four times a day (QID) | INTRAMUSCULAR | Status: DC | PRN
  Administered 2017-03-08: 4 mg via INTRAVENOUS
  Filled 2017-03-08: qty 2

## 2017-03-08 MED ORDER — FERROUS SULFATE 325 (65 FE) MG PO TABS
325.0000 mg | ORAL_TABLET | Freq: Three times a day (TID) | ORAL | Status: DC
  Administered 2017-03-09 (×2): 325 mg via ORAL
  Filled 2017-03-08 (×2): qty 1

## 2017-03-08 MED ORDER — ALBUTEROL SULFATE (2.5 MG/3ML) 0.083% IN NEBU
2.5000 mg | INHALATION_SOLUTION | Freq: Four times a day (QID) | RESPIRATORY_TRACT | Status: DC | PRN
  Administered 2017-03-09: 2.5 mg via RESPIRATORY_TRACT
  Filled 2017-03-08: qty 3

## 2017-03-08 MED ORDER — POTASSIUM CHLORIDE CRYS ER 20 MEQ PO TBCR
40.0000 meq | EXTENDED_RELEASE_TABLET | Freq: Two times a day (BID) | ORAL | Status: DC
Start: 2017-03-08 — End: 2017-03-09
  Administered 2017-03-08: 40 meq via ORAL
  Filled 2017-03-08: qty 2

## 2017-03-08 MED ORDER — MAGNESIUM SULFATE 2 GM/50ML IV SOLN
2.0000 g | Freq: Once | INTRAVENOUS | Status: AC
  Administered 2017-03-08: 2 g via INTRAVENOUS
  Filled 2017-03-08: qty 50

## 2017-03-08 MED ORDER — HEPARIN SODIUM (PORCINE) 5000 UNIT/ML IJ SOLN
5000.0000 [IU] | Freq: Three times a day (TID) | INTRAMUSCULAR | Status: DC
  Administered 2017-03-08 – 2017-03-09 (×2): 5000 [IU] via SUBCUTANEOUS
  Filled 2017-03-08 (×2): qty 1

## 2017-03-08 MED ORDER — ALBUTEROL SULFATE HFA 108 (90 BASE) MCG/ACT IN AERS
2.0000 | INHALATION_SPRAY | Freq: Four times a day (QID) | RESPIRATORY_TRACT | Status: DC | PRN
  Filled 2017-03-08: qty 6.7

## 2017-03-08 MED ORDER — PRAVASTATIN SODIUM 20 MG PO TABS
20.0000 mg | ORAL_TABLET | Freq: Every day | ORAL | Status: DC
  Administered 2017-03-08 – 2017-03-09 (×2): 20 mg via ORAL
  Filled 2017-03-08 (×2): qty 1

## 2017-03-08 MED ORDER — ACETAMINOPHEN 500 MG PO TABS
1000.0000 mg | ORAL_TABLET | Freq: Once | ORAL | Status: AC
  Administered 2017-03-08: 1000 mg via ORAL
  Filled 2017-03-08: qty 2

## 2017-03-08 MED ORDER — PANTOPRAZOLE SODIUM 40 MG PO TBEC
40.0000 mg | DELAYED_RELEASE_TABLET | Freq: Every day | ORAL | Status: DC
  Administered 2017-03-08 – 2017-03-09 (×2): 40 mg via ORAL
  Filled 2017-03-08 (×2): qty 1

## 2017-03-08 MED ORDER — SODIUM CHLORIDE 0.9 % IV SOLN
INTRAVENOUS | Status: DC
  Administered 2017-03-08 – 2017-03-09 (×2): via INTRAVENOUS

## 2017-03-08 MED ORDER — ACETAMINOPHEN 325 MG PO TABS
650.0000 mg | ORAL_TABLET | Freq: Four times a day (QID) | ORAL | Status: DC | PRN
  Administered 2017-03-09 (×2): 650 mg via ORAL
  Filled 2017-03-08 (×2): qty 2

## 2017-03-08 MED ORDER — POLYETHYLENE GLYCOL 3350 17 G PO PACK: 17.0000 g | PACK | Freq: Every day | ORAL | Status: DC | PRN

## 2017-03-08 MED ORDER — IPRATROPIUM-ALBUTEROL 0.5-2.5 (3) MG/3ML IN SOLN
3.0000 mL | Freq: Once | RESPIRATORY_TRACT | Status: AC
  Administered 2017-03-08: 3 mL via RESPIRATORY_TRACT
  Filled 2017-03-08: qty 3

## 2017-03-08 MED ORDER — AMLODIPINE BESYLATE 5 MG PO TABS
10.0000 mg | ORAL_TABLET | Freq: Once | ORAL | Status: AC
  Administered 2017-03-08: 10 mg via ORAL
  Filled 2017-03-08: qty 2

## 2017-03-08 MED ORDER — HYDRALAZINE HCL 50 MG PO TABS
50.0000 mg | ORAL_TABLET | Freq: Once | ORAL | Status: AC
  Administered 2017-03-08: 50 mg via ORAL
  Filled 2017-03-08: qty 1

## 2017-03-08 MED ORDER — POTASSIUM CHLORIDE CRYS ER 20 MEQ PO TBCR
40.0000 meq | EXTENDED_RELEASE_TABLET | Freq: Once | ORAL | Status: AC
  Administered 2017-03-08: 40 meq via ORAL
  Filled 2017-03-08: qty 2

## 2017-03-08 MED ORDER — ACETAMINOPHEN 650 MG RE SUPP
650.0000 mg | Freq: Four times a day (QID) | RECTAL | Status: DC | PRN
Start: 2017-03-08 — End: 2017-03-09

## 2017-03-08 MED ORDER — HYDRALAZINE HCL 20 MG/ML IJ SOLN
20.0000 mg | Freq: Once | INTRAMUSCULAR | Status: AC
  Administered 2017-03-08: 20 mg via INTRAVENOUS
  Filled 2017-03-08: qty 1

## 2017-03-08 NOTE — ED Notes (Signed)
Bed: WA01 Expected date:  Expected time:  Means of arrival:  Comments: EMS RM 1 htn

## 2017-03-08 NOTE — ED Notes (Signed)
Called admission floor to give report, informed that receiving nurse is in a room doing a dressing change.

## 2017-03-08 NOTE — ED Notes (Signed)
Ambulated without difficulty.

## 2017-03-08 NOTE — H&P (Addendum)
History and Physical    Renee Alexander DGL:[id number] DOB: February 23, 1971 DOA: 03/08/2017  PCP: System, Pcp Not In  Patient coming from: PCP  Chief Complaint: Nausea and lightheaded  HPI: Renee Alexander is a 46 y.o. female with medical history significant of hypertension, and iron deficiency anemia noncompliant with her medications who recently got insurance wants her PCPs office and was found to have a blood pressure of 249/130 came into the ED as she was having some chest discomfort, on initial evaluation by the ED physician and her chest disc comfort was minimal, and her headache appeared to be tensional headaches. She was started on home meds and her blood pressure dropped 145/71. Consulted for further evaluation. The patient denies any chest pain shortness of breath she does relate some nausea, lightheadedness and anxiousness.   Review of Systems: As per HPI otherwise 10 point review of systems negative.    Past Medical History:  Diagnosis Date  . Anemia   . Asthma   . Diabetes mellitus without complication ([place])   . Hypertension     Past Surgical History:  Procedure Laterality Date  . CESAREAN SECTION    . TUBAL LIGATION       reports that she has never smoked. She has never used smokeless tobacco. She reports that she does not drink alcohol or use drugs.  Allergies  Allergen Reactions  . Penicillins Other (See Comments)    Has patient had a PCN reaction causing immediate rash, facial/tongue/throat swelling, SOB or lightheadedness with hypotension: No Has patient had a PCN reaction causing severe rash involving mucus membranes or skin necrosis: No Has patient had a PCN reaction that required hospitalization No Has patient had a PCN reaction occurring within the last 10 years: }No If all of the above answers are "NO", then may proceed with Cephalosporin     Family History  Problem Relation Age of Onset  . Drug abuse Mother   . Diabetes Mellitus II Father   . Liver  disease Father     Prior to Admission medications   Medication Sig Start Date End Date Taking? Authorizing Provider  albuterol (PROVENTIL HFA;VENTOLIN HFA) 108 (90 BASE) MCG/ACT inhaler Inhale 2 puffs into the lungs every 6 (six) hours as needed for wheezing or shortness of breath. Patient taking differently: Inhale 2 puffs into the lungs every evening.  06/19/15  Yes Regalado, Belkys A, MD  amLODipine (NORVASC) 10 MG tablet Take 1 tablet (10 mg total) by mouth daily. 06/19/15  Yes Regalado, Belkys A, MD  ferrous sulfate 325 (65 FE) MG tablet Take 1 tablet (325 mg total) by mouth 3 (three) times daily with meals. Patient taking differently: Take 325 mg by mouth daily.  06/19/15  Yes Regalado, Belkys A, MD  hydrALAZINE (APRESOLINE) 25 MG tablet Take 1 tablet (25 mg total) by mouth 2 (two) times daily. 06/19/15  Yes Regalado, Belkys A, MD  ibuprofen (ADVIL,MOTRIN) 200 MG tablet Take 600 mg by mouth every 6 (six) hours as needed for headache or moderate pain.   Yes [provider]  metFORMIN (GLUCOPHAGE) 500 MG tablet Take 1 tablet (500 mg total) by mouth 2 (two) times daily with a meal. 06/19/15  Yes Regalado, Belkys A, MD  omeprazole (PRILOSEC) 20 MG capsule Take 20 mg by mouth daily.   Yes [provider]  pravastatin (PRAVACHOL) 20 MG tablet Take 20 mg by mouth daily.   Yes [provider]  triamcinolone (NASACORT ALLERGY 24HR) 55 MCG/ACT AERO nasal inhaler  Place 2 sprays into the nose daily as needed (allergies).   Yes [provider]  vitamin B-12 100 MCG tablet Take 1 tablet (100 mcg total) by mouth daily. 06/19/15  Yes Regalado, Belkys A, MD  ondansetron (ZOFRAN) 4 MG tablet Take 1 tablet (4 mg total) by mouth every 6 (six) hours as needed for nausea. Patient not taking: Reported on 03/08/2017 06/19/15   Regalado, Jerald Kief A, MD  polyethylene glycol (MIRALAX / GLYCOLAX) packet Take 17 g by mouth 2 (two) times daily. Patient not taking: Reported on 03/08/2017  06/19/15   Regalado, Jerald Kief A, MD  traMADol (ULTRAM) 50 MG tablet Take 1 tablet (50 mg total) by mouth every 8 (eight) hours as needed for moderate pain. Patient not taking: Reported on 03/08/2017 06/19/15   Elmarie Shiley, MD    Physical Exam: Vitals:   03/08/17 1425 03/08/17 1430 03/08/17 1500 03/08/17 1531  BP:  (!) 241/109 (!) 207/104 (!) 214/107  Pulse:  84 73 81  Resp:  19 (!) 22 18  Temp:      TempSrc:      SpO2:  98% 96% 100%  Weight: 101.2 kg (223 lb)     Height: 5\' 4"  (1.626 m)       Constitutional: NAD, calm,She appears uncomfortable  Vitals:   03/08/17 1425 03/08/17 1430 03/08/17 1500 03/08/17 1531  BP:  (!) 241/109 (!) 207/104 (!) 214/107  Pulse:  84 73 81  Resp:  19 (!) 22 18  Temp:      TempSrc:      SpO2:  98% 96% 100%  Weight: 101.2 kg (223 lb)     Height: 5\' 4"  (1.626 m)      Eyes:Pupils equally round and reactive to light.  ENMT:Pamela dentition mucous membranes are moist no exudates.  Neck: normal,normal range of motion. No a JVD  Respiratory:Of accessory muscles with good air movement clear to auscultation.  Cardiovascular:Rate and rhythm with positive S1 and S2 no murmurs rubs gallops no JVD.  Abdomen:Positive bowel sounds, nontender nondistended  Musculoskeletal:Intact  Skin:No rashes or ulcerations.  Neurologic:Awake alert and oriented 3 nonfocal, she was lightheaded upon standing.  Psychiatric:Judgment and insight appeared normal.    Labs on Admission: I have personally reviewed following labs and imaging studies  CBC:  Recent Labs Lab 03/08/17 1424  WBC 7.1  HGB 7.5*  HCT 26.9*  MCV 66.1*  PLT 099   Basic Metabolic Panel:  Recent Labs Lab 03/08/17 1424  NA 140  K 3.0*  CL 106  CO2 24  GLUCOSE 94  BUN 9  CREATININE 1.08*  CALCIUM 9.2   GFR: Estimated Creatinine Clearance: 75.3 mL/min (A) (by C-G formula based on SCr of 1.08 mg/dL (H)). Liver Function Tests: No results for input(s): AST, ALT, ALKPHOS, BILITOT, PROT,  ALBUMIN in the last 168 hours. No results for input(s): LIPASE, AMYLASE in the last 168 hours. No results for input(s): AMMONIA in the last 168 hours. Coagulation Profile: No results for input(s): INR, PROTIME in the last 168 hours. Cardiac Enzymes: No results for input(s): CKTOTAL, CKMB, CKMBINDEX, TROPONINI in the last 168 hours. BNP (last 3 results) No results for input(s): PROBNP in the last 8760 hours. HbA1C: No results for input(s): HGBA1C in the last 72 hours. CBG:  Recent Labs Lab 03/08/17 1513  GLUCAP 86   Lipid Profile: No results for input(s): CHOL, HDL, LDLCALC, TRIG, CHOLHDL, LDLDIRECT in the last 72 hours. Thyroid Function Tests: No results for input(s): TSH, T4TOTAL, FREET4, T3FREE,  THYROIDAB in the last 72 hours. Anemia Panel: No results for input(s): VITAMINB12, FOLATE, FERRITIN, TIBC, IRON, RETICCTPCT in the last 72 hours. Urine analysis:    Component Value Date/Time   COLORURINE STRAW (A) 03/08/2017 1436   APPEARANCEUR CLEAR 03/08/2017 1436   LABSPEC 1.004 (L) 03/08/2017 1436   PHURINE 8.0 03/08/2017 1436   GLUCOSEU NEGATIVE 03/08/2017 1436   HGBUR SMALL (A) 03/08/2017 1436   BILIRUBINUR NEGATIVE 03/08/2017 1436   KETONESUR NEGATIVE 03/08/2017 1436   PROTEINUR >=300 (A) 03/08/2017 1436   NITRITE NEGATIVE 03/08/2017 1436   LEUKOCYTESUR NEGATIVE 03/08/2017 1436    Radiological Exams on Admission: Dg Chest 1 View  Result Date: 03/08/2017 CLINICAL DATA:  Shortness of breath and chest tightness EXAM: CHEST 1 VIEW COMPARISON:  June 17, 2015 FINDINGS: There is no edema or consolidation. Heart remains enlarged with pulmonary vascularity within normal limits. No adenopathy. No evident bone lesions. IMPRESSION: Persistent cardiomegaly.  No edema or consolidation. Electronically Signed   By: Lowella Grip III M.D.   On: 03/08/2017 17:19    EKG: Independently reviewed. Normal sinus rhythm normal axis left ventricular hypertrophy with prolonged  QT.  Assessment/Plan Essential HTN (hypertension)/Orthostatic hypotension /Hypertensive urgency: She was initially significantly hypertensive in the ED with pressure 214/107 after clonidine 50 of hydralazine oral, 20 of hydralazine IV and 10 of Norvasc her blood pressure is now 134/60. So she is probably symptomatic from the sudden drop in blood pressure. Keep in [place] position. I will go ahead and hold all hypertensive medications started on IV fluid hydration aggressively, we'll give her a liter of normal saline and then continue her 125. Recheck blood pressure in the morning. Repeated EKG is unchanged. Facet of cardiac biomarkers is negative.  Hypokalemia: Replete orally give magnesium IV and recheck in the morning.  Iron deficiency anemia Check anemia panel starter for sulfate orally. MCV is low RDW is high hemoglobin 7.5, this is close to her usual baseline. Will check CBC in the morning.  Acute kidney injury: Baseline creatinine less then 0.8 At admission 1.1, will hydrate aggressively check a basic metabolic panel in the morning.  DVT prophylaxis: lovenox Code Status: full Family Communication: None Disposition Plan: Observation Consults called: none Admission status: obsevration   Charlynne Cousins MD [facility] Pager [phone]  If 7PM-7AM, please contact night-coverage [email or web address] Password [facility]  03/08/2017, 5:43 PM

## 2017-03-08 NOTE — ED Provider Notes (Signed)
[facility] DEPT Provider Note   CSN: [id number] Arrival date & time: 03/08/17  1403     History   Chief Complaint Chief Complaint  Patient presents with  . Hypertension    HPI Renee Alexander is a 46 y.o. female.  HPI Patient reports she has not had her antihypertensive medications because her insurance had run out. She reports that she got her insurance card and thus had her appointment scheduled today with her PCP. She reports at the appointment, her blood pressure was significantly elevated. She reports she did not know that her blood pressure had gotten that high. Report from EMS is that the office measured the blood pressure at 249\130. Patient denied she had been having any symptoms leading up to this. She reports she works 2 jobs so she always anticipates being fatigued. She reports sometimes she'll get a headache but if she takes something for it usually just goes away. She reports she's had a mild headache today but it does not seem unusual or atypical, there are no associated symptoms, no visual changes, no confusion, no focal weakness numbness or tingling, no nausea or vomiting. Patient is also denying any chest pain. She reports that sometimes with her asthma she has a perception of tightness in her chest which usually occurs in the evening after she's gotten home from work. She reports it typically is improved by taking her albuterol. She denies blurred vision or double vision. Past Medical History:  Diagnosis Date  . Anemia   . Asthma   . Diabetes mellitus without complication ([place])   . Hypertension     Patient Active Problem List   Diagnosis Date Noted  . Anemia 06/17/2015  . Shortness of breath 06/17/2015  . HTN (hypertension) 06/17/2015  . Iron deficiency anemia 05/07/2013  . Weakness generalized 05/06/2013  . Acute blood loss anemia 05/06/2013  . Hypokalemia 05/06/2013  . Chest pain 05/06/2013    Past Surgical History:  Procedure Laterality Date  .  CESAREAN SECTION    . TUBAL LIGATION      OB History    No data available       Home Medications    Prior to Admission medications   Medication Sig Start Date End Date Taking? Authorizing Provider  albuterol (PROVENTIL HFA;VENTOLIN HFA) 108 (90 BASE) MCG/ACT inhaler Inhale 2 puffs into the lungs every 6 (six) hours as needed for wheezing or shortness of breath. Patient taking differently: Inhale 2 puffs into the lungs every evening.  06/19/15  Yes Regalado, Belkys A, MD  amLODipine (NORVASC) 10 MG tablet Take 1 tablet (10 mg total) by mouth daily. 06/19/15  Yes Regalado, Belkys A, MD  ferrous sulfate 325 (65 FE) MG tablet Take 1 tablet (325 mg total) by mouth 3 (three) times daily with meals. Patient taking differently: Take 325 mg by mouth daily.  06/19/15  Yes Regalado, Belkys A, MD  hydrALAZINE (APRESOLINE) 25 MG tablet Take 1 tablet (25 mg total) by mouth 2 (two) times daily. 06/19/15  Yes Regalado, Belkys A, MD  ibuprofen (ADVIL,MOTRIN) 200 MG tablet Take 600 mg by mouth every 6 (six) hours as needed for headache or moderate pain.   Yes [provider]  metFORMIN (GLUCOPHAGE) 500 MG tablet Take 1 tablet (500 mg total) by mouth 2 (two) times daily with a meal. 06/19/15  Yes Regalado, Belkys A, MD  omeprazole (PRILOSEC) 20 MG capsule Take 20 mg by mouth daily.   Yes [provider]  pravastatin (PRAVACHOL) 20 MG  tablet Take 20 mg by mouth daily.   Yes [provider]  triamcinolone (NASACORT ALLERGY 24HR) 55 MCG/ACT AERO nasal inhaler Place 2 sprays into the nose daily as needed (allergies).   Yes [provider]  vitamin B-12 100 MCG tablet Take 1 tablet (100 mcg total) by mouth daily. 06/19/15  Yes Regalado, Belkys A, MD  ondansetron (ZOFRAN) 4 MG tablet Take 1 tablet (4 mg total) by mouth every 6 (six) hours as needed for nausea. Patient not taking: Reported on 03/08/2017 06/19/15   Regalado, Jerald Kief A, MD  polyethylene glycol (MIRALAX / GLYCOLAX)  packet Take 17 g by mouth 2 (two) times daily. Patient not taking: Reported on 03/08/2017 06/19/15   Regalado, Jerald Kief A, MD  traMADol (ULTRAM) 50 MG tablet Take 1 tablet (50 mg total) by mouth every 8 (eight) hours as needed for moderate pain. Patient not taking: Reported on 03/08/2017 06/19/15   Elmarie Shiley, MD    Family History No family history on file.  Social History Social History  Substance Use Topics  . Smoking status: Never Smoker  . Smokeless tobacco: Never Used  . Alcohol use No     Allergies   Penicillins   Review of Systems Review of Systems 10 Systems reviewed and are negative for acute change except as noted in the HPI.  Physical Exam Updated Vital Signs BP (!) 214/107 (BP Location: Right Arm)   Pulse 81   Temp 98.4 F (36.9 C) (Oral)   Resp 18   Ht 5\' 4"  (1.626 m)   Wt 101.2 kg (223 lb)   LMP 02/06/2017   SpO2 100%   BMI 38.28 kg/m   Physical Exam  Constitutional: She is oriented to person, place, and time.  Patient is alert and nontoxic. She is clinically well and appearance. Moderate obesity. No respiratory distress.  HENT:  Head: Normocephalic and atraumatic.  Nose: Nose normal.  Mouth/Throat: Oropharynx is clear and moist.  Eyes: Pupils are equal, round, and reactive to light. Conjunctivae and EOM are normal.  Neck: Neck supple.  Cardiovascular: Normal rate, regular rhythm, normal heart sounds and intact distal pulses.   Pulmonary/Chest: Effort normal and breath sounds normal.  Abdominal: Soft. She exhibits no distension. There is no tenderness. There is no guarding.  Musculoskeletal: Normal range of motion. She exhibits no edema, tenderness or deformity.  Neurological: She is alert and oriented to person, place, and time. No cranial nerve deficit. She exhibits normal muscle tone. Coordination normal.  Normal finger-nose exam. Normal cognitive function and speech. Normal grip strength bilaterally.  Skin: Skin is warm and dry.    Psychiatric: She has a normal mood and affect.     ED Treatments / Results  Labs (all labs ordered are listed, but only abnormal results are displayed) Labs Reviewed  BASIC METABOLIC PANEL - Abnormal; Notable for the following:       Result Value   Potassium 3.0 (*)    Creatinine, Ser 1.08 (*)    All other components within normal limits  CBC - Abnormal; Notable for the following:    Hemoglobin 7.5 (*)    HCT 26.9 (*)    MCV 66.1 (*)    MCH 18.4 (*)    MCHC 27.9 (*)    RDW 21.8 (*)    All other components within normal limits  URINALYSIS, ROUTINE W REFLEX MICROSCOPIC - Abnormal; Notable for the following:    Color, Urine STRAW (*)    Specific Gravity, Urine 1.004 (*)  Hgb urine dipstick SMALL (*)    Protein, ur >=300 (*)    Bacteria, UA RARE (*)    Squamous Epithelial / LPF 0-5 (*)    All other components within normal limits  CBG MONITORING, ED  I-STAT TROPONIN, ED  I-STAT BETA HCG BLOOD, ED ([facility], [facility], [facility] ONLY)  TYPE AND SCREEN    EKG  EKG Interpretation  Date/Time:  Friday March 08 2017 14:23:30 EDT Ventricular Rate:  78 PR Interval:    QRS Duration: 101 QT Interval:  440 QTC Calculation: 502 R Axis:   89 Text Interpretation:  Sinus rhythm Probable left atrial enlargement Left ventricular hypertrophy Borderline prolonged QT interval no change from previous Confirmed by Charlesetta Shanks [phone]) on 03/08/2017 2:55:10 PM       Radiology No results found.  Procedures Procedures (including critical care time) CRITICAL CARE Performed by: Charlesetta Shanks   Total critical care time: 30 minutes  Critical care time was exclusive of separately billable procedures and treating other patients.  Critical care was necessary to treat or prevent imminent or life-threatening deterioration.  Critical care was time spent personally by me on the following activities: development of treatment plan with patient and/or surrogate as well as nursing, discussions with  consultants, evaluation of patient's response to treatment, examination of patient, obtaining history from patient or surrogate, ordering and performing treatments and interventions, ordering and review of laboratory studies, ordering and review of radiographic studies, pulse oximetry and re-evaluation of patient's condition. Medications Ordered in ED Medications  potassium chloride SA (K-DUR,KLOR-CON) CR tablet 40 mEq (not administered)  ipratropium-albuterol (DUONEB) 0.5-2.5 (3) MG/3ML nebulizer solution 3 mL (not administered)  0.9 %  sodium chloride infusion (not administered)  acetaminophen (TYLENOL) tablet 1,000 mg (not administered)  amLODipine (NORVASC) tablet 10 mg (10 mg Oral Given 03/08/17 1552)  hydrALAZINE (APRESOLINE) tablet 50 mg (50 mg Oral Given 03/08/17 1544)  hydrALAZINE (APRESOLINE) injection 20 mg (20 mg Intravenous Given 03/08/17 1544)  cloNIDine (CATAPRES) tablet 0.2 mg (0.2 mg Oral Given 03/08/17 1544)     Initial Impression / Assessment and Plan / ED Course  I have reviewed the triage vital signs and the nursing notes.  Pertinent labs & imaging results that were available during my care of the patient were reviewed by me and considered in my medical decision making (see chart for details).     Recheck 16:50 patient reports she feels worse. She reports she feels lightheaded and nauseated. She denies chest pain. She reports she does have asthma however and think she needs a breathing treatments.  Final Clinical Impressions(s) / ED Diagnoses   Final diagnoses:  Hypertensive urgency  Symptomatic anemia  Hypokalemia   Patient has electrolyte derangement in conjunction with anemia and hypertensive urgency. Treatment was initiated with the patient's oral medications of hydralazine and amlodipine administered as well as by mouth clonidine and one IV dose of hydralazine 20 mg. After treatment, patient reported she felt worse, she reported feeling increased lightheadedness,  nausea and shortness of breath. Patient's mental status remains clear although she is very anxious and tearful, neurologic exam is normal. Lungs are clear. With the patient presenting with significantly elevated blood pressure and ongoing symptoms, plan for admission for hypertensive emergency\urgency. Of note however patient does not have signs of encephalopathy. Her mental status is clear and neurologic exam is normal. She also does not have decompensated congestive heart failure by exam and x-ray however she does have significant chroni anemia which could certainly increased risk for demand  ischemia in the presence of pre-existing cardiac risk factors. New Prescriptions New Prescriptions   No medications on file     Charlesetta Shanks, MD 03/09/17 [id number]

## 2017-03-08 NOTE — Progress Notes (Signed)
Patient c/o nausea and vomited per nurse tech. Nothing ordered for nausea. Provider on call paged. Will c/t monitor.

## 2017-03-08 NOTE — ED Triage Notes (Signed)
Per EMS, pt transported from her doctor's office.  Reports h/a today, BP with EMS 249/130.  Reports having chest tightness at the doctor's office which subsided.  Pt is A&Ox 4.  Pt has hx of HTN and has not taken her meds for a while.

## 2017-03-09 ENCOUNTER — Other Ambulatory Visit: Payer: Self-pay | Admitting: Internal Medicine

## 2017-03-09 DIAGNOSIS — D500 Iron deficiency anemia secondary to blood loss (chronic): Secondary | ICD-10-CM | POA: Diagnosis not present

## 2017-03-09 DIAGNOSIS — E876 Hypokalemia: Secondary | ICD-10-CM | POA: Diagnosis not present

## 2017-03-09 DIAGNOSIS — I10 Essential (primary) hypertension: Secondary | ICD-10-CM | POA: Diagnosis not present

## 2017-03-09 DIAGNOSIS — D649 Anemia, unspecified: Secondary | ICD-10-CM

## 2017-03-09 DIAGNOSIS — I160 Hypertensive urgency: Secondary | ICD-10-CM | POA: Diagnosis not present

## 2017-03-09 LAB — COMPREHENSIVE METABOLIC PANEL
ALBUMIN: 3.1 g/dL — AB (ref 3.5–5.0)
ALT: 13 U/L — AB (ref 14–54)
AST: 19 U/L (ref 15–41)
Alkaline Phosphatase: 40 U/L (ref 38–126)
Anion gap: 9 (ref 5–15)
BUN: 9 mg/dL (ref 6–20)
CHLORIDE: 109 mmol/L (ref 101–111)
CO2: 24 mmol/L (ref 22–32)
CREATININE: 1.03 mg/dL — AB (ref 0.44–1.00)
Calcium: 8.7 mg/dL — ABNORMAL LOW (ref 8.9–10.3)
GFR calc Af Amer: >60 mL/min (ref >60)
GFR calc non Af Amer: >60 mL/min (ref >60)
GLUCOSE: 117 mg/dL — AB (ref 65–99)
POTASSIUM: 3.2 mmol/L — AB (ref 3.5–5.1)
Sodium: 142 mmol/L (ref 135–145)
Total Bilirubin: 0.4 mg/dL (ref 0.3–1.2)
Total Protein: 6.7 g/dL (ref 6.5–8.1)

## 2017-03-09 LAB — CBC
HEMATOCRIT: 21.4 % — AB (ref 36.0–46.0)
Hemoglobin: 6.2 g/dL — CL (ref 12.0–15.0)
MCH: 19.2 pg — ABNORMAL LOW (ref 26.0–34.0)
MCHC: 29 g/dL — AB (ref 30.0–36.0)
MCV: 66.3 fL — AB (ref 78.0–100.0)
Platelets: 274 10*3/uL (ref 150–400)
RBC: 3.23 MIL/uL — ABNORMAL LOW (ref 3.87–5.11)
RDW: 22.2 % — AB (ref 11.5–15.5)
WBC: 6.8 10*3/uL (ref 4.0–10.5)

## 2017-03-09 LAB — PREPARE RBC (CROSSMATCH)

## 2017-03-09 MED ORDER — POTASSIUM CHLORIDE CRYS ER 20 MEQ PO TBCR
40.0000 meq | EXTENDED_RELEASE_TABLET | Freq: Three times a day (TID) | ORAL | Status: DC
  Administered 2017-03-09 (×2): 40 meq via ORAL
  Filled 2017-03-09 (×2): qty 2

## 2017-03-09 MED ORDER — LISINOPRIL 5 MG PO TABS: 5.0000 mg | ORAL_TABLET | Freq: Every day | ORAL | 11 refills | Status: DC

## 2017-03-09 MED ORDER — SODIUM CHLORIDE 0.9 % IV SOLN: Freq: Once | INTRAVENOUS | Status: DC

## 2017-03-09 NOTE — Progress Notes (Signed)
CRITICAL VALUE ALERT  Critical Value: Hgb 6.2  Date & Time Notied:  660600 at 640am by V.Jimmye Norman, RN  Provider Notified: [phone] M. Lynch  Orders Received/Actions taken: Awaiting callback

## 2017-03-09 NOTE — Progress Notes (Signed)
Patient discharged home.  Leaving with personal belongings and one prescription.  Patient reports understanding of discharge instructions.  Room air, denies pain, no s/s of distress.  Accompanied by daughter.  No complaints.

## 2017-03-09 NOTE — Discharge Summary (Signed)
Physician Discharge Summary  Renee Alexander FGH:[id number] DOB: 1970-10-31 DOA: 03/08/2017  PCP: System, Pcp Not In  Admit date: 03/08/2017 Discharge date: 03/09/2017  Admitted From: [facility] Disposition:  Home  Recommendations for Outpatient Follow-up:  1. Follow up with PCP in 1 weeks 2. Please obtain BMP/CBC in one week 3. Check Bp.  Home Health:No Equipment/Devices:none  Discharge Condition:stable CODE STATUS:full Diet recommendation: Heart Healthy   Brief/Interim Summary: 46 y.o. female with medical history significant of hypertension, and iron deficiency anemia noncompliant with her medications who recently got insurance wants her PCPs office and was found to have a blood pressure of 249/130 came into the ED as she was having some chest discomfort, on initial evaluation by the ED physician and her chest disc comfort was minimal, and her headache appeared to be tensional headaches. She was started on home meds and her blood pressure dropped 145/71. Consulted for further evaluation. The patient denies any chest pain shortness of breath she does relate some nausea, lightheadedness and anxiousness.  Discharge Diagnoses:  Active Problems:   Hypokalemia   Iron deficiency anemia   HTN (hypertension)   Hypertensive urgency   Orthostatic hypotension  Essential HTN (hypertension)/Orthostatic hypotension /Hypertensive urgency: She was initially significantly hypertensive in the ED with pressure 214/107 after clonidine 50 of hydralazine oral, 20 of hydralazine IV and 10 of Norvasc her blood pressure is now 134/60. So she is probably symptomatic from the sudden drop in blood pressure. She was put in Trendelenburg and hydrated aggressively her symptoms resolved by the next day. A repeat blood pressure shows it was in the 140/76. Since she is a diabetic and would be a good idea to to start on 8 inhibitor, have told her she needs to stop her ibuprofen. She has an 0.0 with her primary care doctor  next week and she will go home on Norvasc and low-dose of lisinopril will check a basic metabolic panel at that time titrate antihypertensive medications as needed.  Hyperkalemia: Was repleted orally.  Iron deficiency anemia: After IV fluid hydration her hemoglobin dropped to 6.2 she was given 2 units of packed blood cells her CBC was rechecked and her hemoglobin improved. She will go home on oral iron.  Acute kidney injury: Improving with IV fluid hydration.  Discharge Instructions  Discharge Instructions    Diet - low sodium heart healthy    Complete by:  As directed    Increase activity slowly    Complete by:  As directed      Allergies as of 03/09/2017      Reactions   Penicillins Other (See Comments)   Has patient had a PCN reaction causing immediate rash, facial/tongue/throat swelling, SOB or lightheadedness with hypotension: No Has patient had a PCN reaction causing severe rash involving mucus membranes or skin necrosis: No Has patient had a PCN reaction that required hospitalization No Has patient had a PCN reaction occurring within the last 10 years: }No If all of the above answers are "NO", then may proceed with Cephalosporin       Medication List    STOP taking these medications   hydrALAZINE 25 MG tablet Commonly known as:  APRESOLINE   ibuprofen 200 MG tablet Commonly known as:  ADVIL,MOTRIN     TAKE these medications   albuterol 108 (90 Base) MCG/ACT inhaler Commonly known as:  PROVENTIL HFA;VENTOLIN HFA Inhale 2 puffs into the lungs every 6 (six) hours as needed for wheezing or shortness of breath. What changed:  when to  take this   amLODipine 10 MG tablet Commonly known as:  NORVASC Take 1 tablet (10 mg total) by mouth daily.   cyanocobalamin 100 MCG tablet Take 1 tablet (100 mcg total) by mouth daily.   ferrous sulfate 325 (65 FE) MG tablet Take 1 tablet (325 mg total) by mouth 3 (three) times daily with meals. What changed:  when to take this    lisinopril 5 MG tablet Commonly known as:  PRINIVIL,ZESTRIL Take 1 tablet (5 mg total) by mouth daily.   metFORMIN 500 MG tablet Commonly known as:  GLUCOPHAGE Take 1 tablet (500 mg total) by mouth 2 (two) times daily with a meal.   NASACORT ALLERGY 24HR 55 MCG/ACT Aero nasal inhaler Generic drug:  triamcinolone Place 2 sprays into the nose daily as needed (allergies).   omeprazole 20 MG capsule Commonly known as:  PRILOSEC Take 20 mg by mouth daily.   ondansetron 4 MG tablet Commonly known as:  ZOFRAN Take 1 tablet (4 mg total) by mouth every 6 (six) hours as needed for nausea.   polyethylene glycol packet Commonly known as:  MIRALAX / GLYCOLAX Take 17 g by mouth 2 (two) times daily.   pravastatin 20 MG tablet Commonly known as:  PRAVACHOL Take 20 mg by mouth daily.   traMADol 50 MG tablet Commonly known as:  ULTRAM Take 1 tablet (50 mg total) by mouth every 8 (eight) hours as needed for moderate pain.       Allergies  Allergen Reactions  . Penicillins Other (See Comments)    Has patient had a PCN reaction causing immediate rash, facial/tongue/throat swelling, SOB or lightheadedness with hypotension: No Has patient had a PCN reaction causing severe rash involving mucus membranes or skin necrosis: No Has patient had a PCN reaction that required hospitalization No Has patient had a PCN reaction occurring within the last 10 years: }No If all of the above answers are "NO", then may proceed with Cephalosporin     Consultations:  None   Procedures/Studies: Dg Chest 1 View  Result Date: 03/08/2017 CLINICAL DATA:  Shortness of breath and chest tightness EXAM: CHEST 1 VIEW COMPARISON:  June 17, 2015 FINDINGS: There is no edema or consolidation. Heart remains enlarged with pulmonary vascularity within normal limits. No adenopathy. No evident bone lesions. IMPRESSION: Persistent cardiomegaly.  No edema or consolidation. Electronically Signed   By: Lowella Grip III  M.D.   On: 03/08/2017 17:19      Subjective: No complain.  Discharge Exam: Vitals:   03/08/17 2131 03/09/17 0550  BP: (!) 153/89 140/76  Pulse: 83 81  Resp: 20 18  Temp: 98.3 F (36.8 C) 98.8 F (37.1 C)   Vitals:   03/08/17 1730 03/08/17 1849 03/08/17 2131 03/09/17 0550  BP: (!) 145/71 (!) 186/89 (!) 153/89 140/76  Pulse:  97 83 81  Resp: 18 20 20 18   Temp:  99.1 F (37.3 C) 98.3 F (36.8 C) 98.8 F (37.1 C)  TempSrc:  Oral Oral Oral  SpO2:  100% 100% 100%  Weight:      Height:        General: In no acute distress. Cardiovascular: Regular rate and rhythm positive S1-S2. Respiratory: Good air movement clear to auscultation. Abdominal: Bowel sounds soft nontender nondistended Extremities: No edema.    The results of significant diagnostics from this hospitalization (including imaging, microbiology, ancillary and laboratory) are listed below for reference.     Microbiology: No results found for this or any previous visit (from  the past 240 hour(s)).   Labs: BNP (last 3 results) No results for input(s): BNP in the last 8760 hours. Basic Metabolic Panel:  Recent Labs Lab 03/08/17 1424 03/09/17 0530  NA 140 142  K 3.0* 3.2*  CL 106 109  CO2 24 24  GLUCOSE 94 117*  BUN 9 9  CREATININE 1.08* 1.03*  CALCIUM 9.2 8.7*  MG 1.6*  --    Liver Function Tests:  Recent Labs Lab 03/09/17 0530  AST 19  ALT 13*  ALKPHOS 40  BILITOT 0.4  PROT 6.7  ALBUMIN 3.1*   No results for input(s): LIPASE, AMYLASE in the last 168 hours. No results for input(s): AMMONIA in the last 168 hours. CBC:  Recent Labs Lab 03/08/17 1424 03/09/17 0530  WBC 7.1 6.8  HGB 7.5* 6.2*  HCT 26.9* 21.4*  MCV 66.1* 66.3*  PLT 297 274   Cardiac Enzymes: No results for input(s): CKTOTAL, CKMB, CKMBINDEX, TROPONINI in the last 168 hours. BNP: Invalid input(s): POCBNP CBG:  Recent Labs Lab 03/08/17 1513  GLUCAP 86   D-Dimer No results for input(s): DDIMER in the last  72 hours. Hgb A1c No results for input(s): HGBA1C in the last 72 hours. Lipid Profile No results for input(s): CHOL, HDL, LDLCALC, TRIG, CHOLHDL, LDLDIRECT in the last 72 hours. Thyroid function studies No results for input(s): TSH, T4TOTAL, T3FREE, THYROIDAB in the last 72 hours.  Invalid input(s): FREET3 Anemia work up No results for input(s): VITAMINB12, FOLATE, FERRITIN, TIBC, IRON, RETICCTPCT in the last 72 hours. Urinalysis    Component Value Date/Time   COLORURINE STRAW (A) 03/08/2017 1436   APPEARANCEUR CLEAR 03/08/2017 1436   LABSPEC 1.004 (L) 03/08/2017 1436   PHURINE 8.0 03/08/2017 1436   GLUCOSEU NEGATIVE 03/08/2017 1436   HGBUR SMALL (A) 03/08/2017 1436   BILIRUBINUR NEGATIVE 03/08/2017 1436   KETONESUR NEGATIVE 03/08/2017 1436   PROTEINUR >=300 (A) 03/08/2017 1436   NITRITE NEGATIVE 03/08/2017 1436   LEUKOCYTESUR NEGATIVE 03/08/2017 1436   Sepsis Labs Invalid input(s): PROCALCITONIN,  WBC,  LACTICIDVEN Microbiology No results found for this or any previous visit (from the past 240 hour(s)).   Time coordinating discharge: Over 30 minutes  SIGNED:   Charlynne Cousins, MD  [facility] 03/09/2017, 8:11 AM Pager   If 7PM-7AM, please contact night-coverage [email or web address] Password [email or web address]

## 2017-03-11 DIAGNOSIS — E785 Hyperlipidemia, unspecified: Secondary | ICD-10-CM | POA: Insufficient documentation

## 2017-03-11 LAB — CBC WITH DIFFERENTIAL/PLATELET
BASOS PCT: 0 %
Basophils Absolute: 0 10*3/uL (ref 0.0–0.1)
EOS ABS: 0.1 10*3/uL (ref 0.0–0.7)
EOS PCT: 1 %
HCT: 27.3 % — ABNORMAL LOW (ref 36.0–46.0)
Hemoglobin: 7.9 g/dL — ABNORMAL LOW (ref 12.0–15.0)
Lymphocytes Relative: 37 %
Lymphs Abs: 2.7 10*3/uL (ref 0.7–4.0)
MCH: 20.3 pg — AB (ref 26.0–34.0)
MCHC: 28.9 g/dL — AB (ref 30.0–36.0)
MCV: 70.2 fL — AB (ref 78.0–100.0)
MONO ABS: 0.4 10*3/uL (ref 0.1–1.0)
Monocytes Relative: 6 %
NEUTROS ABS: 4 10*3/uL (ref 1.7–7.7)
Neutrophils Relative %: 56 %
PLATELETS: 256 10*3/uL (ref 150–400)
RBC: 3.89 MIL/uL (ref 3.87–5.11)
RDW: 23.9 % — ABNORMAL HIGH (ref 11.5–15.5)
WBC: 7.2 10*3/uL (ref 4.0–10.5)

## 2017-03-11 LAB — BPAM RBC
Blood Product Expiration Date: 201807312359
ISSUE DATE / TIME: 201807210957
UNIT TYPE AND RH: 6200

## 2017-03-11 LAB — TYPE AND SCREEN
ABO/RH(D): AB POS
ANTIBODY SCREEN: NEGATIVE
Unit division: 0

## 2017-03-20 LAB — HIV ANTIBODY (ROUTINE TESTING W REFLEX): HIV SCREEN 4TH GENERATION: NONREACTIVE

## 2017-05-24 ENCOUNTER — Ambulatory Visit (HOSPITAL_BASED_OUTPATIENT_CLINIC_OR_DEPARTMENT_OTHER): Payer: 59 | Admitting: Oncology

## 2017-05-24 ENCOUNTER — Telehealth: Payer: Self-pay | Admitting: Oncology

## 2017-05-24 ENCOUNTER — Ambulatory Visit (HOSPITAL_BASED_OUTPATIENT_CLINIC_OR_DEPARTMENT_OTHER): Payer: 59

## 2017-05-24 VITALS — BP 173/84 | HR 95 | Temp 99.0°F | Resp 20 | Ht 64.0 in | Wt 229.8 lb

## 2017-05-24 DIAGNOSIS — D500 Iron deficiency anemia secondary to blood loss (chronic): Secondary | ICD-10-CM | POA: Diagnosis not present

## 2017-05-24 LAB — CBC WITH DIFFERENTIAL/PLATELET
BASO%: 0.6 % (ref 0.0–2.0)
BASOS ABS: 0 10*3/uL (ref 0.0–0.1)
EOS%: 1.4 % (ref 0.0–7.0)
Eosinophils Absolute: 0.1 10*3/uL (ref 0.0–0.5)
HCT: 22.8 % — ABNORMAL LOW (ref 34.8–46.6)
HGB: 7 g/dL — ABNORMAL LOW (ref 11.6–15.9)
LYMPH%: 33.1 % (ref 14.0–49.7)
MCH: 20.5 pg — AB (ref 25.1–34.0)
MCHC: 30.6 g/dL — ABNORMAL LOW (ref 31.5–36.0)
MCV: 67.1 fL — AB (ref 79.5–101.0)
MONO#: 0.3 10*3/uL (ref 0.1–0.9)
MONO%: 5.7 % (ref 0.0–14.0)
NEUT#: 3.3 10*3/uL (ref 1.5–6.5)
NEUT%: 59.2 % (ref 38.4–76.8)
NRBC: 0 % (ref 0–0)
Platelets: 253 10*3/uL (ref 145–400)
RBC: 3.4 10*6/uL — AB (ref 3.70–5.45)
RDW: 19.4 % — AB (ref 11.2–14.5)
WBC: 5.5 10*3/uL (ref 3.9–10.3)
lymph#: 1.8 10*3/uL (ref 0.9–3.3)

## 2017-05-24 LAB — IRON AND TIBC
%SAT: 4 % — ABNORMAL LOW (ref 21–57)
IRON: 15 ug/dL — AB (ref 41–142)
TIBC: 393 ug/dL (ref 236–444)
UIBC: 377 ug/dL (ref 120–384)

## 2017-05-24 LAB — FERRITIN: Ferritin: <4 ng/ml — ABNORMAL LOW (ref 9–269)

## 2017-05-24 NOTE — Progress Notes (Signed)
Reason for Referral: Iron deficiency anemia.   HPI: 46 year old woman currently of [place] after relocating from [place]. He is a pleasant woman with history of hypertension, obesity as well as long-standing iron deficiency anemia. She has been intermittently on oral iron replacement the majority of her adult life. She has reported heavy menses for the last 8 years with menstrual cycles lasting more than 5 days at a time. She has had fewer hospitalizations with she required packed red cell transfusion the last was in July 2018. At that time her hemoglobin was 6.2, MCV was 66 and received 1 unit of packed red cells with a hemoglobin increasing to 7.9. Iron studies in October 2016 showed a level of 12, ferritin of 4 with saturation of 2%. She has been prescribed oral iron although she has not been taking it regularly because of poor tolerance. She reports that dyspepsia, constipation and other GI issues. She denied any diarrhea, hematochezia or melena. Her Hemoccult testing was negative. She reports currently symptoms of fatigue and tiredness and ice cravings. She denies any chest pain or dyspnea exertion. She denied any hemoptysis or hematemesis. She continues to work full time at 2 jobs.  She does not report any headaches, blurry vision, syncope or seizures. She does not report fevers, chills, sweats or weight loss. She does not report any chest pain, palpitation orthopnea or leg edema. She does not report any cough, wheezing or hemoptysis. She does not report any nausea, vomiting or abdominal pain. She does not report any gross satiety or abdominal distention. She does not report any frequency urgency or hesitancy. She does not report any skeletal complaints. Remaining review of systems unremarkable.   Past Medical History:  Diagnosis Date  . Anemia   . Asthma   . Diabetes mellitus without complication ([place])   . Hypertension   :  Past Surgical History:  Procedure Laterality Date  . CESAREAN  SECTION    . TUBAL LIGATION    :   Current Outpatient Prescriptions:  .  albuterol (PROVENTIL HFA;VENTOLIN HFA) 108 (90 BASE) MCG/ACT inhaler, Inhale 2 puffs into the lungs every 6 (six) hours as needed for wheezing or shortness of breath. (Patient taking differently: Inhale 2 puffs into the lungs every evening. ), Disp: 1 Inhaler, Rfl: 2 .  amLODipine (NORVASC) 10 MG tablet, Take 1 tablet (10 mg total) by mouth daily., Disp: 30 tablet, Rfl: 0 .  ferrous sulfate 325 (65 FE) MG tablet, Take 1 tablet (325 mg total) by mouth 3 (three) times daily with meals. (Patient taking differently: Take 325 mg by mouth daily. ), Disp: 90 tablet, Rfl: 3 .  hydrochlorothiazide (MICROZIDE) 12.5 MG capsule, Take 12.5 mg by mouth at bedtime., Disp: , Rfl: 0 .  lisinopril (PRINIVIL,ZESTRIL) 5 MG tablet, Take 1 tablet (5 mg total) by mouth daily., Disp: 30 tablet, Rfl: 11 .  losartan (COZAAR) 100 MG tablet, Take 100 mg by mouth daily., Disp: , Rfl:  .  omeprazole (PRILOSEC) 20 MG capsule, Take 20 mg by mouth daily., Disp: , Rfl:  .  polyethylene glycol (MIRALAX / GLYCOLAX) packet, Take 17 g by mouth 2 (two) times daily., Disp: 14 each, Rfl: 0 .  pravastatin (PRAVACHOL) 20 MG tablet, Take 20 mg by mouth daily., Disp: , Rfl:  .  traMADol (ULTRAM) 50 MG tablet, Take 1 tablet (50 mg total) by mouth every 8 (eight) hours as needed for moderate pain., Disp: 30 tablet, Rfl: 0 .  triamcinolone (NASACORT ALLERGY 24HR) 55 MCG/ACT  AERO nasal inhaler, Place 2 sprays into the nose daily as needed (allergies)., Disp: , Rfl:  .  vitamin B-12 100 MCG tablet, Take 1 tablet (100 mcg total) by mouth daily., Disp: 10 tablet, Rfl: 0:  Allergies  Allergen Reactions  . Penicillins Hives    Has patient had a PCN reaction causing immediate rash, facial/tongue/throat swelling, SOB or lightheadedness with hypotension: No Has patient had a PCN reaction causing severe rash involving mucus membranes or skin necrosis: No Has patient had a PCN  reaction that required hospitalization No Has patient had a PCN reaction occurring within the last 10 years: }No If all of the above answers are "NO", then may proceed with Cephalosporin   :  Family History  Problem Relation Age of Onset  . Drug abuse Mother   . Diabetes Mellitus II Father   . Liver disease Father   :  Social History   Social History  . Marital status: Single    Spouse name: N/A  . Number of children: N/A  . Years of education: N/A   Occupational History  . Not on file.   Social History Main Topics  . Smoking status: Never Smoker  . Smokeless tobacco: Never Used  . Alcohol use No  . Drug use: No  . Sexual activity: Yes   Other Topics Concern  . Not on file   Social History Narrative  . No narrative on file  :  Pertinent items are noted in HPI.  Exam: Blood pressure (!) 173/84, pulse 95, temperature 99 F (37.2 C), temperature source Oral, resp. rate 20, height 5\' 4"  (1.626 m), weight 229 lb 12.8 oz (104.2 kg), SpO2 100 %.  ECOG 0  General appearance: alert and cooperative appeared without distress. Throat: No oral thrush or ulcers. Neck: no adenopathy or masses. Back: negative Resp: clear to auscultation bilaterally without dullness to percussion.  Chest wall: no tenderness Cardio: regular rate and rhythm, S1, S2 normal, no murmur, click, rub or gallop GI: soft, non-tender; bowel sounds normal; no masses,  no organomegaly Extremities: extremities normal, atraumatic, no cyanosis or edema Pulses: 2+ and symmetric  CBC    Component Value Date/Time   WBC 7.2 03/09/2017 1503   RBC 3.89 03/09/2017 1503   HGB 7.9 (L) 03/09/2017 1503   HCT 27.3 (L) 03/09/2017 1503   PLT 256 03/09/2017 1503   MCV 70.2 (L) 03/09/2017 1503   MCH 20.3 (L) 03/09/2017 1503   MCHC 28.9 (L) 03/09/2017 1503   RDW 23.9 (H) 03/09/2017 1503   LYMPHSABS 2.7 03/09/2017 1503   MONOABS 0.4 03/09/2017 1503   EOSABS 0.1 03/09/2017 1503   BASOSABS 0.0 03/09/2017 1503      Chemistry      Component Value Date/Time   NA 142 03/09/2017 0530   K 3.2 (L) 03/09/2017 0530   CL 109 03/09/2017 0530   CO2 24 03/09/2017 0530   BUN 9 03/09/2017 0530   CREATININE 1.03 (H) 03/09/2017 0530      Component Value Date/Time   CALCIUM 8.7 (L) 03/09/2017 0530   ALKPHOS 40 03/09/2017 0530   AST 19 03/09/2017 0530   ALT 13 (L) 03/09/2017 0530   BILITOT 0.4 03/09/2017 0530       Assessment and Plan:   46 year old woman with the following issues:  1. Iron deficiency anemia diagnosed in 2014. At that time her iron level was 13 and ferritin of 2. Her iron level in 2016 was 12 with a ferritin of 4. Her hemoglobin  in July 2018 was 6.2 with an MCV of 66.3. She has an elevated RDW with normal white cell count and platelets.  The differential diagnosis was discussed today with the patient. The etiology of her iron deficiency appears to be related to menstrual blood losses. She has no GI bleeding and Hemoccult testing was negative. Treatment options were reviewed today which include aggressive oral supplements versus intravenous iron. Risks and benefits of both approaches were discussed and she appears to be intolerant to oral iron. Complication associated with Feraheme infusion were reviewed today. These would include arthralgias, myalgias, infusion related complications and rarely anaphylaxis. After discussion today, she is agreeable to receive 1000 mg of Feraheme in split doses. We will repeat hemoglobin and iron studies today.  After completing her IV iron infusion, she will require periodic monitoring and replacement as needed.  2. Menorrhagia: I recommended OB/GYN evaluation to help address this issue. She understands also she has heavy menstrual bleeding, she will require repeated IV iron infusion.  3. Colon cancer screening: Her Hemoccult testing is negative but I recommended colon cancer screening because of her age.  4. Follow-up: Will be in the immediate future to receive  IV iron infusion in 3 months to recheck iron stores.

## 2017-05-24 NOTE — Telephone Encounter (Signed)
Gave patient AVS and calendar of upcoming October appointments °

## 2017-05-31 ENCOUNTER — Ambulatory Visit (HOSPITAL_BASED_OUTPATIENT_CLINIC_OR_DEPARTMENT_OTHER): Payer: 59

## 2017-05-31 VITALS — BP 180/96 | HR 76 | Temp 98.0°F | Resp 20 | Ht 64.0 in

## 2017-05-31 DIAGNOSIS — D500 Iron deficiency anemia secondary to blood loss (chronic): Secondary | ICD-10-CM

## 2017-05-31 MED ORDER — SODIUM CHLORIDE 0.9 % IV SOLN
Freq: Once | INTRAVENOUS | Status: AC
  Administered 2017-05-31: 13:00:00 via INTRAVENOUS

## 2017-05-31 MED ORDER — SODIUM CHLORIDE 0.9 % IV SOLN
510.0000 mg | Freq: Once | INTRAVENOUS | Status: AC
  Administered 2017-05-31: 510 mg via INTRAVENOUS
  Filled 2017-05-31: qty 17

## 2017-05-31 NOTE — Progress Notes (Signed)
B/p 188/94, pt reports being nervous and having abdominal cramping from menstrual cycle. Dr. Alen Blew aware, no new orders, okay to proceed with treatment.

## 2017-05-31 NOTE — Progress Notes (Signed)
Patient tolerated iron infusion well and stable at discharge.

## 2017-05-31 NOTE — Patient Instructions (Signed)

## 2017-06-07 ENCOUNTER — Ambulatory Visit (HOSPITAL_BASED_OUTPATIENT_CLINIC_OR_DEPARTMENT_OTHER): Payer: 59

## 2017-06-07 VITALS — BP 182/84 | HR 79 | Temp 98.6°F | Resp 18

## 2017-06-07 DIAGNOSIS — D509 Iron deficiency anemia, unspecified: Secondary | ICD-10-CM

## 2017-06-07 DIAGNOSIS — D500 Iron deficiency anemia secondary to blood loss (chronic): Secondary | ICD-10-CM

## 2017-06-07 MED ORDER — SODIUM CHLORIDE 0.9 % IV SOLN
Freq: Once | INTRAVENOUS | Status: AC
  Administered 2017-06-07: 12:00:00 via INTRAVENOUS

## 2017-06-07 MED ORDER — SODIUM CHLORIDE 0.9 % IV SOLN
510.0000 mg | Freq: Once | INTRAVENOUS | Status: AC
  Administered 2017-06-07: 510 mg via INTRAVENOUS
  Filled 2017-06-07: qty 17

## 2017-06-07 NOTE — Patient Instructions (Signed)

## 2017-08-08 ENCOUNTER — Encounter (INDEPENDENT_AMBULATORY_CARE_PROVIDER_SITE_OTHER): Payer: 59 | Admitting: Ophthalmology

## 2017-08-08 DIAGNOSIS — I10 Essential (primary) hypertension: Secondary | ICD-10-CM

## 2017-08-08 DIAGNOSIS — E113292 Type 2 diabetes mellitus with mild nonproliferative diabetic retinopathy without macular edema, left eye: Secondary | ICD-10-CM | POA: Diagnosis not present

## 2017-08-08 DIAGNOSIS — H43813 Vitreous degeneration, bilateral: Secondary | ICD-10-CM | POA: Diagnosis not present

## 2017-08-08 DIAGNOSIS — H2511 Age-related nuclear cataract, right eye: Secondary | ICD-10-CM

## 2017-08-08 DIAGNOSIS — E11319 Type 2 diabetes mellitus with unspecified diabetic retinopathy without macular edema: Secondary | ICD-10-CM

## 2017-08-08 DIAGNOSIS — H35033 Hypertensive retinopathy, bilateral: Secondary | ICD-10-CM

## 2017-08-23 ENCOUNTER — Telehealth: Payer: Self-pay | Admitting: Oncology

## 2017-08-23 ENCOUNTER — Ambulatory Visit (HOSPITAL_BASED_OUTPATIENT_CLINIC_OR_DEPARTMENT_OTHER): Payer: 59 | Admitting: Oncology

## 2017-08-23 ENCOUNTER — Other Ambulatory Visit (HOSPITAL_BASED_OUTPATIENT_CLINIC_OR_DEPARTMENT_OTHER): Payer: 59

## 2017-08-23 VITALS — BP 160/83 | HR 93 | Temp 98.7°F | Resp 18 | Ht 64.0 in | Wt 230.8 lb

## 2017-08-23 DIAGNOSIS — D509 Iron deficiency anemia, unspecified: Secondary | ICD-10-CM | POA: Diagnosis not present

## 2017-08-23 DIAGNOSIS — D500 Iron deficiency anemia secondary to blood loss (chronic): Secondary | ICD-10-CM

## 2017-08-23 LAB — CBC WITH DIFFERENTIAL/PLATELET
BASO%: 0.4 % (ref 0.0–2.0)
Basophils Absolute: 0 10*3/uL (ref 0.0–0.1)
EOS%: 3.3 % (ref 0.0–7.0)
Eosinophils Absolute: 0.2 10*3/uL (ref 0.0–0.5)
HCT: 31.6 % — ABNORMAL LOW (ref 34.8–46.6)
HEMOGLOBIN: 10.4 g/dL — AB (ref 11.6–15.9)
LYMPH#: 1.8 10*3/uL (ref 0.9–3.3)
LYMPH%: 32.1 % (ref 14.0–49.7)
MCH: 27 pg (ref 25.1–34.0)
MCHC: 32.9 g/dL (ref 31.5–36.0)
MCV: 82.1 fL (ref 79.5–101.0)
MONO#: 0.2 10*3/uL (ref 0.1–0.9)
MONO%: 3.5 % (ref 0.0–14.0)
NEUT#: 3.3 10*3/uL (ref 1.5–6.5)
NEUT%: 60.7 % (ref 38.4–76.8)
Platelets: 256 10*3/uL (ref 145–400)
RBC: 3.85 10*6/uL (ref 3.70–5.45)
RDW: 16.7 % — AB (ref 11.2–14.5)
WBC: 5.5 10*3/uL (ref 3.9–10.3)
nRBC: 0 % (ref 0–0)

## 2017-08-23 LAB — IRON AND TIBC
%SAT: 17 % — AB (ref 21–57)
Iron: 53 ug/dL (ref 41–142)
TIBC: 310 ug/dL (ref 236–444)
UIBC: 257 ug/dL (ref 120–384)

## 2017-08-23 LAB — FERRITIN: FERRITIN: 16 ng/mL (ref 9–269)

## 2017-08-23 NOTE — Telephone Encounter (Signed)
Gave avs and calendar for January and april °

## 2017-08-23 NOTE — Telephone Encounter (Signed)
Called regarding iron for 1/11

## 2017-08-23 NOTE — Progress Notes (Signed)
Hematology and Oncology Follow Up Visit  Renee Alexander [id number] [birth year] 47 y.o. 08/23/2017 10:32 AM System, Pcp Not InNo ref. provider found   Principle Diagnosis: 47 year old woman with iron deficiency anemia diagnosed in July 2018. At that time her hemoglobin was 6.2, MCV 66 and ferritin of 4. Her iron deficiency is related to heavy menstrual blood losses.   Prior Therapy: IV iron infusion on an intermittent basis.  Current therapy: Feraheme infusion at 1000 mg every 3-6 months as needed.  Interim History: Renee Alexander presents today for a follow-up visit. Since the last visit, she received intravenous iron with Feraheme and completed 5883 mg without complications. She reports feeling well after the infusion with resolution of most of her symptoms. She reports less fatigue and ice cravings. She denied any hematochezia or melena. She denied any shortness of breath or dyspnea on exertion. Continues to have heavy menstrual cycles.  She does not report any headaches, blurry vision, syncope or seizures. She does not report fevers, chills, sweats or weight loss. She does not report any chest pain, palpitation orthopnea or leg edema. She does not report any cough, wheezing or hemoptysis. She does not report any nausea, vomiting or abdominal pain. She does not report any gross satiety or abdominal distention. She does not report any frequency urgency or hesitancy. She does not report any skeletal complaints. Remaining review of systems unremarkable.    Medications: I have reviewed the patient's current medications.  Current Outpatient Medications  Medication Sig Dispense Refill  . albuterol (PROVENTIL HFA;VENTOLIN HFA) 108 (90 BASE) MCG/ACT inhaler Inhale 2 puffs into the lungs every 6 (six) hours as needed for wheezing or shortness of breath. (Patient taking differently: Inhale 2 puffs into the lungs every evening. ) 1 Inhaler 2  . amLODipine (NORVASC) 10 MG tablet Take 1 tablet (10 mg total) by  mouth daily. 30 tablet 0  . ferrous sulfate 325 (65 FE) MG tablet Take 1 tablet (325 mg total) by mouth 3 (three) times daily with meals. (Patient taking differently: Take 325 mg by mouth daily. ) 90 tablet 3  . hydrochlorothiazide (MICROZIDE) 12.5 MG capsule Take 12.5 mg by mouth at bedtime.  0  . lisinopril (PRINIVIL,ZESTRIL) 5 MG tablet Take 1 tablet (5 mg total) by mouth daily. 30 tablet 11  . losartan (COZAAR) 100 MG tablet Take 100 mg by mouth daily.    Marland Kitchen omeprazole (PRILOSEC) 20 MG capsule Take 20 mg by mouth daily.    . polyethylene glycol (MIRALAX / GLYCOLAX) packet Take 17 g by mouth 2 (two) times daily. 14 each 0  . pravastatin (PRAVACHOL) 20 MG tablet Take 20 mg by mouth daily.    . traMADol (ULTRAM) 50 MG tablet Take 1 tablet (50 mg total) by mouth every 8 (eight) hours as needed for moderate pain. 30 tablet 0  . triamcinolone (NASACORT ALLERGY 24HR) 55 MCG/ACT AERO nasal inhaler Place 2 sprays into the nose daily as needed (allergies).    . vitamin B-12 100 MCG tablet Take 1 tablet (100 mcg total) by mouth daily. 10 tablet 0   No current facility-administered medications for this visit.      Allergies:  Allergies  Allergen Reactions  . Penicillins Hives    Has patient had a PCN reaction causing immediate rash, facial/tongue/throat swelling, SOB or lightheadedness with hypotension: No Has patient had a PCN reaction causing severe rash involving mucus membranes or skin necrosis: No Has patient had a PCN reaction that required hospitalization No Has patient  had a PCN reaction occurring within the last 10 years: }No If all of the above answers are "NO", then may proceed with Cephalosporin     Past Medical History, Surgical history, Social history, and Family History were reviewed and updated.  Marland Kitchen Physical Exam: Blood pressure (!) 160/83, pulse 93, temperature 98.7 F (37.1 C), temperature source Oral, resp. rate 18, height 5\' 4"  (1.626 m), weight 230 lb 12.8 oz (104.7 kg),  SpO2 100 %. ECOG: 0 General appearance: alert and cooperative appeared without distress. Head: Normocephalic, without obvious abnormality without thrush or ulcers. Neck: no adenopathy Lymph nodes: Cervical, supraclavicular, and axillary nodes normal. Heart:regular rate and rhythm, S1, S2 normal, no murmur, click, rub or gallop Lung:chest clear, no wheezing, rales, normal symmetric air entry. Abdomin: soft, non-tender, without masses or organomegaly without rebound or guarding. EXT:no erythema, induration, or nodules   Lab Results: Lab Results  Component Value Date   WBC 5.5 08/23/2017   HGB 10.4 (L) 08/23/2017   HCT 31.6 (L) 08/23/2017   MCV 82.1 08/23/2017   PLT 256 08/23/2017     Chemistry      Component Value Date/Time   NA 142 03/09/2017 0530   K 3.2 (L) 03/09/2017 0530   CL 109 03/09/2017 0530   CO2 24 03/09/2017 0530   BUN 9 03/09/2017 0530   CREATININE 1.03 (H) 03/09/2017 0530      Component Value Date/Time   CALCIUM 8.7 (L) 03/09/2017 0530   ALKPHOS 40 03/09/2017 0530   AST 19 03/09/2017 0530   ALT 13 (L) 03/09/2017 0530   BILITOT 0.4 03/09/2017 0530     Results for Renee Alexander (MRN [id number]) as of 08/23/2017 10:23  Ref. Range 05/24/2017 11:37  Iron Latest Ref Range: 41 - 142 ug/dL 15 (L)  UIBC Latest Ref Range: 120 - 384 ug/dL 377  TIBC Latest Ref Range: 236 - 444 ug/dL 393  %SAT Latest Ref Range: 21 - 57 % 4 (L)  Ferritin Latest Ref Range: 9 - 269 ng/ml <4 (L)     Impression and Plan:  47 year old woman with the following issues:  1. Iron deficiency anemia diagnosed in 2014. At that time her iron level was 13 and ferritin of 2. Her iron level in 2016 was 12 with a ferritin of 4. Her hemoglobin in July 2018 was 6.2 with an MCV of 66.3.   She received to [facility] infusion in October 2018 with excellent response.  Her hemoglobin on 08/23/2016 is 10.4 although she continues to have some microcytosis and elevated RDW. Her symptoms has improved  although not completely normalized. Risks and benefits of retreating with IV iron was discussed today and the plan is to proceed with another 1000 mg of Feraheme every 3-6 months to her iron stores are repleted.   2. Menorrhagia: Still ongoing and the main cause of her iron deficiency anemia. I recommended OB/GYN follow-up regarding this issue.  3. Colon cancer screening: Her Hemoccult testing is negative but I recommended colon cancer screening because of her age.  4. Follow-up: Will be in the next week to receive repeat IV iron infusion in 3 months to recheck her counts.       Zola Button, MD 1/4/201910:32 AM

## 2017-08-29 ENCOUNTER — Other Ambulatory Visit: Payer: Self-pay | Admitting: Family Medicine

## 2017-08-29 DIAGNOSIS — I10 Essential (primary) hypertension: Secondary | ICD-10-CM

## 2017-09-06 ENCOUNTER — Inpatient Hospital Stay: Payer: 59 | Attending: Oncology

## 2017-09-06 VITALS — BP 145/67 | HR 65 | Temp 98.7°F | Resp 18

## 2017-09-06 DIAGNOSIS — D500 Iron deficiency anemia secondary to blood loss (chronic): Secondary | ICD-10-CM

## 2017-09-06 MED ORDER — FERUMOXYTOL INJECTION 510 MG/17 ML
510.0000 mg | Freq: Once | INTRAVENOUS | Status: AC
  Administered 2017-09-06: 510 mg via INTRAVENOUS
  Filled 2017-09-06: qty 17

## 2017-09-06 MED ORDER — SODIUM CHLORIDE 0.9 % IV SOLN
Freq: Once | INTRAVENOUS | Status: AC
  Administered 2017-09-06: 10:00:00 via INTRAVENOUS

## 2017-09-06 NOTE — Patient Instructions (Signed)

## 2017-09-13 ENCOUNTER — Inpatient Hospital Stay: Payer: 59

## 2017-09-13 VITALS — BP 157/81 | HR 74 | Temp 97.1°F | Resp 16

## 2017-09-13 DIAGNOSIS — D500 Iron deficiency anemia secondary to blood loss (chronic): Secondary | ICD-10-CM | POA: Diagnosis not present

## 2017-09-13 MED ORDER — FERUMOXYTOL INJECTION 510 MG/17 ML
510.0000 mg | Freq: Once | INTRAVENOUS | Status: AC
  Administered 2017-09-13: 510 mg via INTRAVENOUS
  Filled 2017-09-13: qty 17

## 2017-09-13 MED ORDER — SODIUM CHLORIDE 0.9 % IV SOLN
Freq: Once | INTRAVENOUS | Status: AC
  Administered 2017-09-13: 10:00:00 via INTRAVENOUS

## 2017-09-13 NOTE — Patient Instructions (Signed)

## 2017-12-05 ENCOUNTER — Inpatient Hospital Stay: Payer: 59

## 2017-12-05 ENCOUNTER — Inpatient Hospital Stay: Payer: 59 | Attending: Oncology | Admitting: Oncology

## 2017-12-05 ENCOUNTER — Telehealth: Payer: Self-pay | Admitting: Oncology

## 2017-12-05 VITALS — BP 154/70 | HR 70 | Temp 98.8°F | Resp 18 | Ht 64.0 in | Wt 222.8 lb

## 2017-12-05 VITALS — BP 165/77 | HR 58 | Temp 98.9°F | Resp 17

## 2017-12-05 DIAGNOSIS — D500 Iron deficiency anemia secondary to blood loss (chronic): Secondary | ICD-10-CM

## 2017-12-05 DIAGNOSIS — Z79899 Other long term (current) drug therapy: Secondary | ICD-10-CM | POA: Diagnosis not present

## 2017-12-05 DIAGNOSIS — Z7984 Long term (current) use of oral hypoglycemic drugs: Secondary | ICD-10-CM | POA: Diagnosis not present

## 2017-12-05 DIAGNOSIS — N920 Excessive and frequent menstruation with regular cycle: Secondary | ICD-10-CM | POA: Insufficient documentation

## 2017-12-05 DIAGNOSIS — E119 Type 2 diabetes mellitus without complications: Secondary | ICD-10-CM | POA: Insufficient documentation

## 2017-12-05 DIAGNOSIS — D509 Iron deficiency anemia, unspecified: Secondary | ICD-10-CM

## 2017-12-05 DIAGNOSIS — R5383 Other fatigue: Secondary | ICD-10-CM | POA: Diagnosis not present

## 2017-12-05 LAB — CBC WITH DIFFERENTIAL/PLATELET
BASOS ABS: 0 10*3/uL (ref 0.0–0.1)
BASOS PCT: 1 %
Eosinophils Absolute: 0.1 10*3/uL (ref 0.0–0.5)
Eosinophils Relative: 2 %
HEMATOCRIT: 30.5 % — AB (ref 34.8–46.6)
Hemoglobin: 10.3 g/dL — ABNORMAL LOW (ref 11.6–15.9)
LYMPHS PCT: 35 %
Lymphs Abs: 2 10*3/uL (ref 0.9–3.3)
MCH: 28.7 pg (ref 25.1–34.0)
MCHC: 33.7 g/dL (ref 31.5–36.0)
MCV: 85.2 fL (ref 79.5–101.0)
Monocytes Absolute: 0.3 10*3/uL (ref 0.1–0.9)
Monocytes Relative: 5 %
NEUTROS ABS: 3.2 10*3/uL (ref 1.5–6.5)
Neutrophils Relative %: 57 %
PLATELETS: 251 10*3/uL (ref 145–400)
RBC: 3.58 MIL/uL — AB (ref 3.70–5.45)
RDW: 14.9 % — ABNORMAL HIGH (ref 11.2–14.5)
WBC: 5.6 10*3/uL (ref 3.9–10.3)

## 2017-12-05 LAB — IRON AND TIBC
Iron: 53 ug/dL (ref 41–142)
SATURATION RATIOS: 17 % — AB (ref 21–57)
TIBC: 305 ug/dL (ref 236–444)
UIBC: 253 ug/dL

## 2017-12-05 LAB — FERRITIN: Ferritin: 17 ng/mL (ref 9–269)

## 2017-12-05 MED ORDER — SODIUM CHLORIDE 0.9 % IV SOLN: Freq: Once | INTRAVENOUS | Status: DC

## 2017-12-05 MED ORDER — SODIUM CHLORIDE 0.9 % IV SOLN
510.0000 mg | Freq: Once | INTRAVENOUS | Status: AC
  Administered 2017-12-05: 510 mg via INTRAVENOUS
  Filled 2017-12-05: qty 17

## 2017-12-05 NOTE — Progress Notes (Signed)
Hematology and Oncology Follow Up Visit  Renee Alexander [id number] [birth year] 47 y.o. 12/05/2017 10:04 AM System, Pcp Not InNo ref. provider found   Principle Diagnosis: 47 year old woman with iron deficiency anemia related to heavy menstrual losses diagnosed in July 2018.   Prior Therapy: IV iron infusion on an intermittent basis.  Current therapy: Feraheme infusion at 1000 mg every 2-3 months,   Interim History: Renee Alexander is here for a follow-up.  Since last visit, she received intravenous iron in January 2019 with a significant improvement in her symptoms.  In the last few weeks she has reported increased fatigue and tiredness and she continues to have heavy menstrual vitals.  Her menses are occurring at times multiple times a month and her cycle last over a week with heavy bleeding.  She denies any hematochezia, melena or hemoptysis.  She denied any infusion related complications related to Feraheme.  She denies any shortness of breath or dyspnea on exertion.   She does not report any headaches, blurry vision, syncope or seizures. She does not report fevers, chills, sweats or weight loss. She does not report any chest pain, palpitation orthopnea or leg edema. She does not report any cough, wheezing or hemoptysis. She does not report any nausea, vomiting or abdominal pain. She does not report any early satiety or abdominal distention. She does not report any frequency urgency or hesitancy. She does not report any arthralgias or myalgias. She does not report any petechiae or rash.  She does not report any lymphadenopathy.. Remaining review of systems is negative.    Medications: I have reviewed the patient's current medications.  Current Outpatient Medications  Medication Sig Dispense Refill  . albuterol (PROVENTIL HFA;VENTOLIN HFA) 108 (90 BASE) MCG/ACT inhaler Inhale 2 puffs into the lungs every 6 (six) hours as needed for wheezing or shortness of breath. (Patient taking differently: Inhale  2 puffs into the lungs every evening. ) 1 Inhaler 2  . amLODipine (NORVASC) 10 MG tablet Take 1 tablet (10 mg total) by mouth daily. 30 tablet 0  . ferrous sulfate 325 (65 FE) MG tablet Take 1 tablet (325 mg total) by mouth 3 (three) times daily with meals. (Patient taking differently: Take 325 mg by mouth daily. ) 90 tablet 3  . hydrochlorothiazide (MICROZIDE) 12.5 MG capsule Take 12.5 mg by mouth at bedtime.  0  . lisinopril (PRINIVIL,ZESTRIL) 5 MG tablet Take 1 tablet (5 mg total) by mouth daily. 30 tablet 11  . losartan (COZAAR) 100 MG tablet Take 100 mg by mouth daily.    . metFORMIN (GLUCOPHAGE) 500 MG tablet Take by mouth 2 (two) times daily with a meal.    . Metoprolol Succinate 25 MG CS24 Take 25 mg by mouth daily.    Marland Kitchen omeprazole (PRILOSEC) 20 MG capsule Take 20 mg by mouth daily.    . polyethylene glycol (MIRALAX / GLYCOLAX) packet Take 17 g by mouth 2 (two) times daily. 14 each 0  . pravastatin (PRAVACHOL) 20 MG tablet Take 20 mg by mouth daily.    Marland Kitchen triamcinolone (NASACORT ALLERGY 24HR) 55 MCG/ACT AERO nasal inhaler Place 2 sprays into the nose daily as needed (allergies).     No current facility-administered medications for this visit.      Allergies:  Allergies  Allergen Reactions  . Penicillins Hives    Has patient had a PCN reaction causing immediate rash, facial/tongue/throat swelling, SOB or lightheadedness with hypotension: No Has patient had a PCN reaction causing severe rash involving mucus membranes  or skin necrosis: No Has patient had a PCN reaction that required hospitalization No Has patient had a PCN reaction occurring within the last 10 years: }No If all of the above answers are "NO", then may proceed with Cephalosporin     Past Medical History, Surgical history, Social history, and Family History were reviewed and updated.  Marland Kitchen Physical Exam: Blood pressure (!) 154/70, pulse 70, temperature 98.8 F (37.1 C), temperature source Oral, resp. rate 18, height 5'  4" (1.626 m), weight 222 lb 12.8 oz (101.1 kg), SpO2 100 %. ECOG: 0 General appearance: Well-appearing woman appeared without distress. Head: Atraumatic without abnormalities. Oropharynx: Without any thrush or ulcers. Eyes: No scleral icterus. Lymph nodes: No lymphadenopathy palpated in the cervical, supraclavicular, and axillary nodes.  Heart:regular rate and rhythm, S1, S2 without murmurs. Lung: clear to auscultations in all lung fields.  No wheezing or dullness to percussion. Abdomin: Soft, nontender without any rebound or guarding. Musculoskeletal: No joint deformity or effusion. Skin: No rashes or lesions.   Lab Results: Lab Results  Component Value Date   WBC 5.6 12/05/2017   HGB 10.3 (L) 12/05/2017   HCT 30.5 (L) 12/05/2017   MCV 85.2 12/05/2017   PLT 251 12/05/2017     Chemistry      Component Value Date/Time   NA 142 03/09/2017 0530   K 3.2 (L) 03/09/2017 0530   CL 109 03/09/2017 0530   CO2 24 03/09/2017 0530   BUN 9 03/09/2017 0530   CREATININE 1.03 (H) 03/09/2017 0530      Component Value Date/Time   CALCIUM 8.7 (L) 03/09/2017 0530   ALKPHOS 40 03/09/2017 0530   AST 19 03/09/2017 0530   ALT 13 (L) 03/09/2017 0530   BILITOT 0.4 03/09/2017 0530      Results for Renee Alexander (MRN [id number]) as of 12/05/2017 09:55  Ref. Range 03/09/2017 15:03 05/24/2017 11:37 08/23/2017 10:10  Iron Latest Ref Range: 41 - 142 ug/dL  15 (L) 53  UIBC Latest Ref Range: 120 - 384 ug/dL  377 257  TIBC Latest Ref Range: 236 - 444 ug/dL  393 310  %SAT Latest Ref Range: 21 - 57 %  4 (L) 17 (L)  Ferritin Latest Ref Range: 9 - 269 ng/ml  <4 (L) 16     Impression and Plan:  47 year old woman with:   1. Iron deficiency anemia related to heavy menstrual cycles diagnosed in 2018.  She has received intermittent intravenous iron with improvement overall hemoglobin as well as iron studies.  Her hemoglobin continues to be low and is improving rather slowly.  She continues to experience  heavy menstrual cycles and continuous loss in her iron which likely explains why continuous IV iron infusion is needed.  Risks and benefits of continuing Feraheme infusion every 2-3 months was discussed today and she is agreeable to continue.  Her iron studies are improving but has not normalized given the heavy menstrual bleeding.   2. Menorrhagia: She has a referral to OB/GYN in the near future.  I urged her to follow-up with that appointment as it is critical in controlling her bleeding which is contributing to her iron deficiency.  3. Colon cancer screening: She would benefit from a colonoscopy as well given her age.  Her Hemoccult testing has been negative.  4. Follow-up: Will be in 2 months to receive intravenous iron infusion and in 4 months for an MD follow-up and possibly repeat infusion at that time.  15  minutes was spent with the  patient face-to-face today.  More than 50% of time was dedicated to patient counseling, education and coordination of her multifaceted care.        Zola Button, MD 4/18/201910:04 AM

## 2017-12-05 NOTE — Patient Instructions (Signed)

## 2017-12-05 NOTE — Telephone Encounter (Signed)
Scheduled appt per 4/18 los - patient to get an updated schedule in the treatment area.

## 2017-12-12 ENCOUNTER — Ambulatory Visit: Payer: 59

## 2017-12-31 ENCOUNTER — Ambulatory Visit: Payer: 59 | Admitting: Obstetrics and Gynecology

## 2018-01-21 ENCOUNTER — Encounter: Payer: Self-pay | Admitting: Obstetrics

## 2018-01-21 ENCOUNTER — Ambulatory Visit (INDEPENDENT_AMBULATORY_CARE_PROVIDER_SITE_OTHER): Payer: 59 | Admitting: Obstetrics and Gynecology

## 2018-01-21 ENCOUNTER — Other Ambulatory Visit: Payer: Self-pay

## 2018-01-21 ENCOUNTER — Encounter: Payer: Self-pay | Admitting: Obstetrics and Gynecology

## 2018-01-21 VITALS — BP 168/89 | HR 75 | Ht 64.0 in | Wt 224.2 lb

## 2018-01-21 DIAGNOSIS — N921 Excessive and frequent menstruation with irregular cycle: Secondary | ICD-10-CM

## 2018-01-21 DIAGNOSIS — N6452 Nipple discharge: Secondary | ICD-10-CM

## 2018-01-21 DIAGNOSIS — Z124 Encounter for screening for malignant neoplasm of cervix: Secondary | ICD-10-CM

## 2018-01-21 DIAGNOSIS — Z1151 Encounter for screening for human papillomavirus (HPV): Secondary | ICD-10-CM

## 2018-01-21 DIAGNOSIS — Z0000 Encounter for general adult medical examination without abnormal findings: Secondary | ICD-10-CM | POA: Diagnosis not present

## 2018-01-21 DIAGNOSIS — N852 Hypertrophy of uterus: Secondary | ICD-10-CM | POA: Diagnosis not present

## 2018-01-21 NOTE — Progress Notes (Signed)
Pt presents for annual and pap smear. Declined STD testing. Pt c/o irregular menses, last period in April. She started spotting last Thursday and still spotting today. Pt c/o L breast yellow discharge only during cycles if she has one. She has never had a Mammogram.

## 2018-01-21 NOTE — Progress Notes (Signed)
GYNECOLOGY ANNUAL PREVENTATIVE CARE ENCOUNTER NOTE  Subjective:   Renee Alexander is a 47 y.o. [id number] female here for a annual gynecologic exam. Current complaints: heavy cycles in the last few years, increased clotting. Has always had regular cycle, monthly with 4-5 full days of bleeding and then 2 days of spotting. Recently, she has had heavier periods where she needs to stand in the tub, also with more cramping and more painful periods. Did not get period in May, has been spotting for a week.  Also with light-colored nipple discharge during her menses, only happens during menses. Has been going on for < one year.   H/o IV iron transfusion and blood transfusion 2/2 anemia which has been felt due to heavy menses.   Had BTL in 2007 and then became pregnant, had second BTL.  Declines STI testing.  Gynecologic History Patient's last menstrual period was 01/16/2018. Contraception: tubal ligation Last Pap: ~2014 Results were: always normal Last mammogram: n/a  Obstetric History OB History  Gravida Para Term Preterm AB Living  10 5 4 1 5 6   SAB TAB Ectopic Multiple Live Births    5   1 6     # Outcome Date GA Lbr Len/2nd Weight Sex Delivery Anes PTL Lv  10 Term 01/28/98 [redacted]w[redacted]d    Vag-Spont Pud  LIV  9 Term 11/18/95 [redacted]w[redacted]d    Vag-Spont EPI  LIV  8A Preterm 07/21/93 [redacted]w[redacted]d    CS-LTranv Spinal  LIV  8B Preterm 07/21/93      Spinal  LIV  7 Term 01/29/88     Vag-Spont EPI  LIV  6 Term 11/11/85 [redacted]w[redacted]d   M Vag-Spont EPI  LIV  5 TAB           4 TAB           3 TAB           2 TAB           1 TAB             Past Medical History:  Diagnosis Date  . Anemia   . Asthma   . Diabetes mellitus without complication ([place])   . Genital herpes   . Hypertension   reports DM is well controlled HTN is moderately well controlled  Past Surgical History:  Procedure Laterality Date  . CESAREAN SECTION    . TUBAL LIGATION      Current Outpatient Medications on File Prior to Visit    Medication Sig Dispense Refill  . albuterol (PROVENTIL HFA;VENTOLIN HFA) 108 (90 BASE) MCG/ACT inhaler Inhale 2 puffs into the lungs every 6 (six) hours as needed for wheezing or shortness of breath. (Patient taking differently: Inhale 2 puffs into the lungs every evening. ) 1 Inhaler 2  . amLODipine (NORVASC) 10 MG tablet Take 1 tablet (10 mg total) by mouth daily. 30 tablet 0  . hydrochlorothiazide (MICROZIDE) 12.5 MG capsule Take 12.5 mg by mouth at bedtime.  0  . lisinopril (PRINIVIL,ZESTRIL) 5 MG tablet Take 1 tablet (5 mg total) by mouth daily. 30 tablet 11  . losartan (COZAAR) 100 MG tablet Take 100 mg by mouth daily.    . metFORMIN (GLUCOPHAGE) 500 MG tablet Take by mouth 2 (two) times daily with a meal.    . Metoprolol Succinate 25 MG CS24 Take 25 mg by mouth daily.    Marland Kitchen omeprazole (PRILOSEC) 20 MG capsule Take 20 mg by mouth daily.    . Potassium Chloride ER  20 MEQ TBCR potassium chloride ER 20 mEq tablet,extended release    . pravastatin (PRAVACHOL) 20 MG tablet Take 20 mg by mouth daily.    . ferrous sulfate 325 (65 FE) MG tablet Take 1 tablet (325 mg total) by mouth 3 (three) times daily with meals. (Patient not taking: Reported on 01/21/2018) 90 tablet 3  . polyethylene glycol (MIRALAX / GLYCOLAX) packet Take 17 g by mouth 2 (two) times daily. (Patient not taking: Reported on 01/21/2018) 14 each 0  . triamcinolone (NASACORT ALLERGY 24HR) 55 MCG/ACT AERO nasal inhaler Place 2 sprays into the nose daily as needed (allergies).     No current facility-administered medications on file prior to visit.     Allergies  Allergen Reactions  . Penicillins Hives    Has patient had a PCN reaction causing immediate rash, facial/tongue/throat swelling, SOB or lightheadedness with hypotension: No Has patient had a PCN reaction causing severe rash involving mucus membranes or skin necrosis: No Has patient had a PCN reaction that required hospitalization No Has patient had a PCN reaction occurring  within the last 10 years: }No If all of the above answers are "NO", then may proceed with Cephalosporin     Social History   Socioeconomic History  . Marital status: Single    Spouse name: Not on file  . Number of children: Not on file  . Years of education: Not on file  . Highest education level: Not on file  Occupational History  . Not on file  Social Needs  . Financial resource strain: Not on file  . Food insecurity:    Worry: Not on file    Inability: Not on file  . Transportation needs:    Medical: Not on file    Non-medical: Not on file  Tobacco Use  . Smoking status: Never Smoker  . Smokeless tobacco: Never Used  Substance and Sexual Activity  . Alcohol use: No  . Drug use: No  . Sexual activity: Yes    Birth control/protection: Surgical  Lifestyle  . Physical activity:    Days per week: Not on file    Minutes per session: Not on file  . Stress: Not on file  Relationships  . Social connections:    Talks on phone: Not on file    Gets together: Not on file    Attends religious service: Not on file    Active member of club or organization: Not on file    Attends meetings of clubs or organizations: Not on file    Relationship status: Not on file  . Intimate partner violence:    Fear of current or ex partner: Not on file    Emotionally abused: Not on file    Physically abused: Not on file    Forced sexual activity: Not on file  Other Topics Concern  . Not on file  Social History Narrative  . Not on file    Family History  Problem Relation Age of Onset  . Drug abuse Mother   . Diabetes Mellitus II Father   . Liver disease Father     Diet: "could be better", no weight gain or loss Exercise: doesn't get much exercise  The following portions of the patient's history were reviewed and updated as appropriate: allergies, current medications, past family history, past medical history, past social history, past surgical history and problem list.  Review of  Systems Pertinent items are noted in HPI.   Objective:  BP (!) 168/89  Pulse 75   Ht 5\' 4"  (1.626 m)   Wt 224 lb 3.2 oz (101.7 kg)   LMP 01/16/2018   BMI 38.48 kg/m  CONSTITUTIONAL: Well-developed, well-nourished female in no acute distress.  HENT:  Normocephalic, atraumatic, External right and left ear normal. Oropharynx is clear and moist EYES: Conjunctivae and EOM are normal. Pupils are equal, round, and reactive to light. No scleral icterus.  NECK: Normal range of motion, supple, no masses.  Normal thyroid.  SKIN: Skin is warm and dry. No rash noted. Not diaphoretic. No erythema. No pallor. NEUROLOGIC: Alert and oriented to person, place, and time. Normal reflexes, muscle tone coordination. No cranial nerve deficit noted. PSYCHIATRIC: Normal mood and affect. Normal behavior. Normal judgment and thought content. CARDIOVASCULAR: Normal heart rate noted, regular rhythm RESPIRATORY: Clear to auscultation bilaterally. Effort and breath sounds normal, no problems with respiration noted. BREASTS: Symmetric in size. No masses, skin changes, nipple drainage, or lymphadenopathy. Unable to elicit any nipple discharge ABDOMEN: Soft, normal bowel sounds, no distention noted.  No tenderness, rebound or guarding.  PELVIC: Normal appearing external genitalia; normal appearing vaginal mucosa and cervix.  No abnormal discharge noted.  Pap smear obtained. Uterus mildly enlarged, approx 14 weeks sized, mobile, no other palpable masses, no uterine or adnexal tenderness. MUSCULOSKELETAL: Normal range of motion. No tenderness.  No cyanosis, clubbing, or edema.  2+ distal pulses.   Assessment and Plan:   1. Annual physical exam - Cytology - PAP - MM 3D SCREEN BREAST BILATERAL; Future - Ambulatory referral to Gastroenterology  2. Enlarged uterus - US PELVIC COMPLETE WITH TRANSVAGINAL; Future  3. Menorrhagia with irregular cycle - US PELVIC COMPLETE WITH TRANSVAGINAL; Future - recommend EMB for  heavier bleeding, patient is agreeable, will have her return for f/u visit after [place] and EMB - reviewed recommendation for medical management of AUB, likelihood that she is entering perimenopause with irregular bleeding - as she has required multiple IV iron transfusions for anemia that is felt to be secondary to heavy menses, she would like definitive management in the form of a hysterectomy - will obtain imaging and EMB and make plan for management  4. Nipple discharge Not seen on exam, only with menses Mammogram ordered   Will follow up results of pap smear and manage accordingly. Encouraged improvement in diet and exercise.  Mammogram scheduled Colonoscopy ordered    Routine preventative health maintenance measures emphasized. Please refer to After Visit Summary for other counseling recommendations.     Feliz Beam, M.D. Attending [place], [facility] for [facility] [employer] [facility], [place]

## 2018-01-22 LAB — CYTOLOGY - PAP
Diagnosis: NEGATIVE
HPV: NOT DETECTED

## 2018-01-23 MED ORDER — METRONIDAZOLE 500 MG PO TABS: ORAL_TABLET | ORAL | 0 refills | Status: DC

## 2018-01-23 NOTE — Addendum Note (Signed)
Addended by: Vivien Rota on: 01/23/2018 10:03 AM   Modules accepted: Orders

## 2018-01-24 ENCOUNTER — Telehealth: Payer: Self-pay | Admitting: Obstetrics and Gynecology

## 2018-01-24 NOTE — Telephone Encounter (Signed)
Spoke with patient concerning results, and treatment

## 2018-01-24 NOTE — Telephone Encounter (Signed)
Returned call for results 

## 2018-01-30 ENCOUNTER — Inpatient Hospital Stay: Payer: 59 | Attending: Oncology

## 2018-01-30 ENCOUNTER — Inpatient Hospital Stay: Payer: 59

## 2018-01-30 VITALS — BP 158/69 | HR 60 | Temp 98.5°F | Resp 18 | Wt 224.8 lb

## 2018-01-30 DIAGNOSIS — D500 Iron deficiency anemia secondary to blood loss (chronic): Secondary | ICD-10-CM

## 2018-01-30 DIAGNOSIS — D509 Iron deficiency anemia, unspecified: Secondary | ICD-10-CM | POA: Insufficient documentation

## 2018-01-30 LAB — CBC WITH DIFFERENTIAL/PLATELET
Basophils Absolute: 0 10*3/uL (ref 0.0–0.1)
Basophils Relative: 0 %
EOS ABS: 0.1 10*3/uL (ref 0.0–0.5)
EOS PCT: 2 %
HCT: 31.4 % — ABNORMAL LOW (ref 34.8–46.6)
Hemoglobin: 10.5 g/dL — ABNORMAL LOW (ref 11.6–15.9)
LYMPHS ABS: 1.6 10*3/uL (ref 0.9–3.3)
LYMPHS PCT: 34 %
MCH: 28.8 pg (ref 25.1–34.0)
MCHC: 33.4 g/dL (ref 31.5–36.0)
MCV: 86.3 fL (ref 79.5–101.0)
Monocytes Absolute: 0.2 10*3/uL (ref 0.1–0.9)
Monocytes Relative: 5 %
Neutro Abs: 2.9 10*3/uL (ref 1.5–6.5)
Neutrophils Relative %: 59 %
PLATELETS: 223 10*3/uL (ref 145–400)
RBC: 3.64 MIL/uL — AB (ref 3.70–5.45)
RDW: 13.6 % (ref 11.2–14.5)
WBC: 4.8 10*3/uL (ref 3.9–10.3)

## 2018-01-30 LAB — IRON AND TIBC
Iron: 58 ug/dL (ref 41–142)
SATURATION RATIOS: 22 % (ref 21–57)
TIBC: 259 ug/dL (ref 236–444)
UIBC: 201 ug/dL

## 2018-01-30 LAB — FERRITIN: Ferritin: 44 ng/mL (ref 9–269)

## 2018-01-30 MED ORDER — SODIUM CHLORIDE 0.9 % IV SOLN
Freq: Once | INTRAVENOUS | Status: AC
  Administered 2018-01-30: 11:00:00 via INTRAVENOUS

## 2018-01-30 MED ORDER — SODIUM CHLORIDE 0.9 % IV SOLN
510.0000 mg | Freq: Once | INTRAVENOUS | Status: AC
  Administered 2018-01-30: 510 mg via INTRAVENOUS
  Filled 2018-01-30: qty 17

## 2018-01-30 NOTE — Patient Instructions (Signed)

## 2018-02-04 ENCOUNTER — Ambulatory Visit (HOSPITAL_COMMUNITY)
Admission: RE | Admit: 2018-02-04 | Discharge: 2018-02-04 | Disposition: A | Discharge status: Home / Self Care | Payer: 59 | Source: Ambulatory Visit | Attending: Obstetrics and Gynecology | Admitting: Obstetrics and Gynecology

## 2018-02-04 DIAGNOSIS — N921 Excessive and frequent menstruation with irregular cycle: Secondary | ICD-10-CM

## 2018-02-04 DIAGNOSIS — N852 Hypertrophy of uterus: Secondary | ICD-10-CM | POA: Insufficient documentation

## 2018-02-04 DIAGNOSIS — N920 Excessive and frequent menstruation with regular cycle: Secondary | ICD-10-CM | POA: Insufficient documentation

## 2018-02-05 NOTE — Telephone Encounter (Signed)
Pt made aware of gastroenterology referral.

## 2018-02-05 NOTE — Telephone Encounter (Signed)
-----   Message from Sloan Leiter, MD sent at 02/05/2018  2:49 PM EDT ----- Please call patient and advise her that she needs referral to Gastroenterology for routine colonoscopy and they should be calling her to schedule. Thanks.

## 2018-02-06 ENCOUNTER — Inpatient Hospital Stay: Payer: 59

## 2018-02-06 ENCOUNTER — Other Ambulatory Visit: Payer: Self-pay | Admitting: Oncology

## 2018-02-06 VITALS — BP 172/88 | HR 63 | Temp 98.2°F | Resp 16

## 2018-02-06 DIAGNOSIS — D509 Iron deficiency anemia, unspecified: Secondary | ICD-10-CM | POA: Diagnosis not present

## 2018-02-06 DIAGNOSIS — D500 Iron deficiency anemia secondary to blood loss (chronic): Secondary | ICD-10-CM

## 2018-02-06 MED ORDER — SODIUM CHLORIDE 0.9 % IV SOLN
510.0000 mg | Freq: Once | INTRAVENOUS | Status: AC
  Administered 2018-02-06: 510 mg via INTRAVENOUS
  Filled 2018-02-06: qty 17

## 2018-02-06 NOTE — Patient Instructions (Signed)

## 2018-02-25 ENCOUNTER — Other Ambulatory Visit (HOSPITAL_COMMUNITY)
Admission: RE | Admit: 2018-02-25 | Discharge: 2018-02-25 | Disposition: A | Discharge status: Home / Self Care | Payer: 59 | Source: Ambulatory Visit | Attending: Obstetrics and Gynecology | Admitting: Obstetrics and Gynecology

## 2018-02-25 ENCOUNTER — Encounter: Payer: Self-pay | Admitting: Obstetrics and Gynecology

## 2018-02-25 ENCOUNTER — Ambulatory Visit (INDEPENDENT_AMBULATORY_CARE_PROVIDER_SITE_OTHER): Payer: 59 | Admitting: Obstetrics and Gynecology

## 2018-02-25 VITALS — BP 160/88 | HR 76 | Wt 225.7 lb

## 2018-02-25 DIAGNOSIS — N939 Abnormal uterine and vaginal bleeding, unspecified: Secondary | ICD-10-CM

## 2018-02-25 DIAGNOSIS — N83202 Unspecified ovarian cyst, left side: Secondary | ICD-10-CM

## 2018-02-25 DIAGNOSIS — Z3202 Encounter for pregnancy test, result negative: Secondary | ICD-10-CM | POA: Diagnosis not present

## 2018-02-25 DIAGNOSIS — N949 Unspecified condition associated with female genital organs and menstrual cycle: Secondary | ICD-10-CM

## 2018-02-25 DIAGNOSIS — D259 Leiomyoma of uterus, unspecified: Secondary | ICD-10-CM | POA: Diagnosis not present

## 2018-02-25 DIAGNOSIS — N6452 Nipple discharge: Secondary | ICD-10-CM

## 2018-02-25 DIAGNOSIS — A599 Trichomoniasis, unspecified: Secondary | ICD-10-CM

## 2018-02-25 LAB — POCT URINE PREGNANCY: Preg Test, Ur: NEGATIVE

## 2018-02-25 NOTE — Progress Notes (Signed)
ENDOMETRIAL BIOPSY      Renee Alexander is a 47 y.o. [id number] here for endometrial biopsy.  The indications for endometrial biopsy were reviewed.  Risks of the biopsy including cramping, bleeding, infection, uterine perforation, inadequate specimen and need for additional procedures were discussed. The patient states she understands and agrees to undergo procedure today. Consent was signed. Time out was performed.   Indications: AUB Urine HCG: negative  A bivalve speculum was placed into the vagina and the cervix was easily visualized and was prepped with Betadine x2. A single-toothed tenaculum was placed on the anterior lip of the cervix to stabilize it. The 3 mm pipelle was introduced into the endometrial cavity without difficulty to a depth of 12 cm, and a moderate amount of tissue was obtained and sent to pathology. This was repeated for a total of 3 passes. The instruments were removed from the patient's vagina. No bleeding from the cervix at the tenaculum was noted.   The patient tolerated the procedure well. Routine post-procedure instructions were given to the patient.     Feliz Beam, M.D. Center for [facility]

## 2018-02-25 NOTE — Progress Notes (Signed)
Pt is here for F/U after [place] last month.

## 2018-02-25 NOTE — Progress Notes (Signed)
GYNECOLOGY OFFICE FOLLOW UP NOTE  History:  47 y.o. [id number] here today for follow up for annual exam, s/p TVUS. Has not had period in last two months, denies new complaints. Took txt for trichomonas (dx on pap smear). Reviewed negative pap and results of [place], need for follow up for complex ovarian cyst.  Past Medical History:  Diagnosis Date  . Anemia   . Asthma   . Diabetes mellitus without complication ([place])   . Genital herpes   . Hypertension     Past Surgical History:  Procedure Laterality Date  . CESAREAN SECTION    . TUBAL LIGATION       Current Outpatient Medications:  .  albuterol (PROVENTIL HFA;VENTOLIN HFA) 108 (90 BASE) MCG/ACT inhaler, Inhale 2 puffs into the lungs every 6 (six) hours as needed for wheezing or shortness of breath. (Patient taking differently: Inhale 2 puffs into the lungs every evening. ), Disp: 1 Inhaler, Rfl: 2 .  amLODipine (NORVASC) 10 MG tablet, Take 1 tablet (10 mg total) by mouth daily., Disp: 30 tablet, Rfl: 0 .  hydrochlorothiazide (MICROZIDE) 12.5 MG capsule, Take 12.5 mg by mouth at bedtime., Disp: , Rfl: 0 .  lisinopril (PRINIVIL,ZESTRIL) 5 MG tablet, Take 1 tablet (5 mg total) by mouth daily., Disp: 30 tablet, Rfl: 11 .  losartan (COZAAR) 100 MG tablet, Take 100 mg by mouth daily., Disp: , Rfl:  .  metFORMIN (GLUCOPHAGE) 500 MG tablet, Take by mouth 2 (two) times daily with a meal., Disp: , Rfl:  .  Metoprolol Succinate 25 MG CS24, Take 25 mg by mouth daily., Disp: , Rfl:  .  omeprazole (PRILOSEC) 20 MG capsule, Take 20 mg by mouth daily., Disp: , Rfl:  .  polyethylene glycol (MIRALAX / GLYCOLAX) packet, Take 17 g by mouth 2 (two) times daily., Disp: 14 each, Rfl: 0 .  Potassium Chloride ER 20 MEQ TBCR, potassium chloride ER 20 mEq tablet,extended release, Disp: , Rfl:  .  pravastatin (PRAVACHOL) 20 MG tablet, Take 20 mg by mouth daily., Disp: , Rfl:  .  triamcinolone (NASACORT ALLERGY 24HR) 55 MCG/ACT AERO nasal inhaler, Place 2 sprays  into the nose daily as needed (allergies)., Disp: , Rfl:  .  ferrous sulfate 325 (65 FE) MG tablet, Take 1 tablet (325 mg total) by mouth 3 (three) times daily with meals. (Patient not taking: Reported on 02/25/2018), Disp: 90 tablet, Rfl: 3 .  metroNIDAZOLE (FLAGYL) 500 MG tablet, Take all four tablets at once, if you have nausea and cannot tolerate treatment, please call the office. (Patient not taking: Reported on 02/25/2018), Disp: 4 tablet, Rfl: 0  The following portions of the patient's history were reviewed and updated as appropriate: allergies, current medications, past family history, past medical history, past social history, past surgical history and problem list.   Review of Systems:  Pertinent items noted in HPI and remainder of comprehensive ROS otherwise negative.   Objective:  Physical Exam BP (!) 160/88   Pulse 76   Wt 225 lb 11.2 oz (102.4 kg)   LMP 02/05/2018 (Approximate) Comment: light spotting   BMI 38.74 kg/m  CONSTITUTIONAL: Well-developed, well-nourished female in no acute distress.  HENT:  Normocephalic, atraumatic. External right and left ear normal. Oropharynx is clear and moist EYES: Conjunctivae and EOM are normal. Pupils are equal, round, and reactive to light. No scleral icterus.  NECK: Normal range of motion, supple, no masses SKIN: Skin is warm and dry. No rash noted. Not diaphoretic. No erythema. No  pallor. NEUROLOGIC: Alert and oriented to person, place, and time. Normal reflexes, muscle tone coordination. No cranial nerve deficit noted. PSYCHIATRIC: Normal mood and affect. Normal behavior. Normal judgment and thought content. CARDIOVASCULAR: Normal heart rate noted RESPIRATORY: Effort and breath sounds normal, no problems with respiration noted ABDOMEN: Soft, no distention noted.   PELVIC: normal appearing external female genitalia, normal appearing cervix MUSCULOSKELETAL: Normal range of motion. No edema noted.  Labs and Imaging US Pelvic Complete With  Transvaginal  Result Date: 02/04/2018 CLINICAL DATA:  Heavy menses, menorrhagia, enlarged uterus EXAM: TRANSABDOMINAL AND TRANSVAGINAL ULTRASOUND OF PELVIS TECHNIQUE: Both transabdominal and transvaginal ultrasound examinations of the pelvis were performed. Transabdominal technique was performed for global imaging of the pelvis including uterus, ovaries, adnexal regions, and pelvic cul-de-sac. It was necessary to proceed with endovaginal exam following the transabdominal exam to visualize the endometrium and LEFT ovary. COMPARISON:  None FINDINGS: Uterus Measurements: 14.3 x 10.8 x 14.5 cm. Enlarged nodular uterus consistent with multiple leiomyomata. Large leiomyoma at the anterior LEFT lateral aspect of uterus 8.7 x 6.6 x 7.4 cm, likely transmural. Additional masses are seen at the RIGHT fundus 4.6 x 4.8 x 5.4 cm and posteriorly on RIGHT 5.1 x 5.1 x 5.2 cm partially calcified and extending submucosal. Endometrium Thickness: 10 mm thick. Distorted by leiomyomata. No endometrial fluid. Right ovary Measurements: 2.6 x 1.6 x 1.6 cm.  Normal morphology without mass Left ovary Measurements: 5.4 x 2.9 x 5.0 cm. Complex cystic lesion of the LEFT ovary 4.2 x 4.3 x 3.0 cm containing numerous thin to slightly thicker/irregular septations and a fluid fluid level. Other findings Trace free pelvic fluid.  No other adnexal masses. IMPRESSION: Enlarged uterus containing multiple large leiomyomata. Complicated cystic lesion of the LEFT ovary containing irregular septations and independent fluid fluid level, 4.3 cm in greatest size; cystic ovarian neoplasm not excluded. Consider either surgical evaluation or further characterization by MR. Electronically Signed   By: Lavonia Dana M.D.   On: 02/04/2018 14:00    Assessment & Plan:  1. Cyst of left ovary - reviewed complex ovarian cyst with patient and need for further follow up, reviewed possibility of malignancy, and that she would be referred to oncology for management if MR  or Ca125 was suspicious. Removed that it likely needed to be removed if not suspicious for malignancy given complexity, she is agreeable - MR [place]; Future - CA 125  2. Uterine leiomyoma, unspecified location - Reviewed that majority of fibroids are benign and they do not need to be removed however if they are cause of heavy bleeding, it will not improve until they are removed - however, as her complaints are increased dysmenorrhea and extremely heavy periods requiring iron transfusion in the past that are likely 2/2 fibroids, recommend hysterectomy as definitive management, she is agreeable to hysterectomy and understands she will not be able to bear children after hysterectomy - will defer definitive management until ovarian cyst further investigated  3. Abnormal uterine bleeding (AUB) Has not bled in last two months EMB today for h/o AUB, please see procedure note  4. Discharge from left nipple - MM Digital Diagnostic Bilat; Future - [place] Unlisted Procedure Breast; Future  5. Trichomonas vaginalis infection S/p txt States partner was tested and tested negative   Routine preventative health maintenance measures emphasized. Please refer to After Visit Summary for other counseling recommendations.   Return in about 1 month (around 03/25/2018).    Feliz Beam, M.D. [facility]

## 2018-02-26 LAB — CA 125: CANCER ANTIGEN (CA) 125: 18.3 U/mL (ref 0.0–38.1)

## 2018-02-28 ENCOUNTER — Other Ambulatory Visit: Payer: Self-pay | Admitting: Obstetrics and Gynecology

## 2018-02-28 ENCOUNTER — Ambulatory Visit
Admission: RE | Admit: 2018-02-28 | Discharge: 2018-02-28 | Disposition: A | Discharge status: Home / Self Care | Payer: 59 | Source: Ambulatory Visit | Attending: Obstetrics and Gynecology | Admitting: Obstetrics and Gynecology

## 2018-02-28 ENCOUNTER — Encounter: Payer: Self-pay | Admitting: *Deleted

## 2018-02-28 DIAGNOSIS — N6452 Nipple discharge: Secondary | ICD-10-CM

## 2018-02-28 DIAGNOSIS — N6489 Other specified disorders of breast: Secondary | ICD-10-CM

## 2018-02-28 DIAGNOSIS — N6320 Unspecified lump in the left breast, unspecified quadrant: Secondary | ICD-10-CM

## 2018-03-05 ENCOUNTER — Ambulatory Visit
Admission: RE | Admit: 2018-03-05 | Discharge: 2018-03-05 | Disposition: A | Discharge status: Home / Self Care | Payer: 59 | Source: Ambulatory Visit | Attending: Obstetrics and Gynecology | Admitting: Obstetrics and Gynecology

## 2018-03-05 DIAGNOSIS — N6489 Other specified disorders of breast: Secondary | ICD-10-CM

## 2018-03-05 DIAGNOSIS — N6320 Unspecified lump in the left breast, unspecified quadrant: Secondary | ICD-10-CM

## 2018-03-18 ENCOUNTER — Other Ambulatory Visit: Payer: Self-pay | Admitting: General Surgery

## 2018-03-18 DIAGNOSIS — D242 Benign neoplasm of left breast: Secondary | ICD-10-CM

## 2018-03-19 ENCOUNTER — Telehealth: Payer: Self-pay

## 2018-03-19 NOTE — Telephone Encounter (Signed)
Returned call to advise pt that biopsy results are in Called twice, no answer, unable to leave vm Pt needs follow up appt with provider to review results.

## 2018-03-24 ENCOUNTER — Other Ambulatory Visit: Payer: Self-pay | Admitting: General Surgery

## 2018-03-24 DIAGNOSIS — D242 Benign neoplasm of left breast: Secondary | ICD-10-CM

## 2018-03-25 ENCOUNTER — Encounter: Payer: Self-pay | Admitting: Obstetrics and Gynecology

## 2018-03-25 ENCOUNTER — Encounter: Payer: Self-pay | Admitting: *Deleted

## 2018-03-25 ENCOUNTER — Ambulatory Visit (INDEPENDENT_AMBULATORY_CARE_PROVIDER_SITE_OTHER): Payer: 59 | Admitting: Obstetrics and Gynecology

## 2018-03-25 VITALS — BP 161/89 | HR 88 | Wt 228.4 lb

## 2018-03-25 DIAGNOSIS — K219 Gastro-esophageal reflux disease without esophagitis: Secondary | ICD-10-CM | POA: Insufficient documentation

## 2018-03-25 DIAGNOSIS — D250 Submucous leiomyoma of uterus: Secondary | ICD-10-CM | POA: Diagnosis not present

## 2018-03-25 DIAGNOSIS — N83202 Unspecified ovarian cyst, left side: Secondary | ICD-10-CM | POA: Diagnosis not present

## 2018-03-25 DIAGNOSIS — N939 Abnormal uterine and vaginal bleeding, unspecified: Secondary | ICD-10-CM | POA: Diagnosis not present

## 2018-03-25 DIAGNOSIS — D251 Intramural leiomyoma of uterus: Secondary | ICD-10-CM | POA: Diagnosis not present

## 2018-03-25 NOTE — Progress Notes (Signed)
Pt is here to discuss endometrial biopsy from 02/25/18.

## 2018-03-25 NOTE — Progress Notes (Signed)
GYNECOLOGY OFFICE FOLLOW UP NOTE  History:  47 y.o. [id number] here today for follow up for AUB, ovarian cyst. Has been diagnosed with [place] and has lumpectomy planned for 04/09/18. Had cycle in July after EMB with significant pain and large blood clot but pain subsided after she passed clot.    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 She has been slightly overwhelmed by this diagnosis but is glad to get it taken care of.   Past Medical History:  Diagnosis Date  . Anemia   . Asthma   . Diabetes mellitus without complication ([place])   . Genital herpes   . Hypertension     Past Surgical History:  Procedure Laterality Date  . CESAREAN SECTION    . TUBAL LIGATION      Current Outpatient Medications:  .  amLODipine (NORVASC) 10 MG tablet, Take 1 tablet (10 mg total) by mouth daily., Disp: 30 tablet, Rfl: 0 .  hydrochlorothiazide (MICROZIDE) 12.5 MG capsule, Take 12.5 mg by mouth at bedtime., Disp: , Rfl: 0 .  losartan (COZAAR) 100 MG tablet, Take 100 mg by mouth daily., Disp: , Rfl:  .  metFORMIN (GLUCOPHAGE) 500 MG tablet, Take by mouth 2 (two) times daily with a meal., Disp: , Rfl:  .  Metoprolol Succinate 25 MG CS24, Take 25 mg by mouth daily., Disp: , Rfl:  .  omeprazole (PRILOSEC) 20 MG capsule, Take 20 mg by mouth daily., Disp: , Rfl:  .  polyethylene glycol (MIRALAX / GLYCOLAX) packet, Take 17 g by mouth 2 (two) times daily., Disp: 14 each, Rfl: 0 .  Potassium Chloride ER 20 MEQ TBCR, potassium chloride ER 20 mEq tablet,extended release, Disp: , Rfl:  .  pravastatin (PRAVACHOL) 20 MG tablet, Take 20 mg by mouth daily., Disp: , Rfl:  .  albuterol (PROVENTIL HFA;VENTOLIN HFA) 108 (90 BASE) MCG/ACT inhaler, Inhale 2 puffs into the lungs every 6 (six) hours as needed for wheezing or shortness of breath. (Patient taking differently: Inhale 2 puffs into the lungs every evening. ), Disp: 1 Inhaler, Rfl: 2 .  ferrous sulfate 325 (65 FE) MG  tablet, Take 1 tablet (325 mg total) by mouth 3 (three) times daily with meals. (Patient not taking: Reported on 02/25/2018), Disp: 90 tablet, Rfl: 3 .  lisinopril (PRINIVIL,ZESTRIL) 5 MG tablet, Take 1 tablet (5 mg total) by mouth daily., Disp: 30 tablet, Rfl: 11 .  metroNIDAZOLE (FLAGYL) 500 MG tablet, Take all four tablets at once, if you have nausea and cannot tolerate treatment, please call the office. (Patient not taking: Reported on 02/25/2018), Disp: 4 tablet, Rfl: 0 .  triamcinolone (NASACORT ALLERGY 24HR) 55 MCG/ACT AERO nasal inhaler, Place 2 sprays into the nose daily as needed (allergies)., Disp: , Rfl:   The following portions of the patient's history were reviewed and updated as appropriate: allergies, current medications, past family history, past medical history, past social history, past surgical history and problem list.   Review of Systems:  Pertinent items noted in HPI and remainder of comprehensive ROS otherwise negative.   Objective:  Physical Exam BP (!) 161/89   Pulse 88   Wt 228 lb 6.4 oz (103.6 kg)   LMP 03/03/2018 (Approximate)   BMI 39.20 kg/m  CONSTITUTIONAL: Well-developed, well-nourished female in no acute distress.  NEUROLOGIC: Alert and oriented to person, place, and time. Normal reflexes, muscle tone coordination. No cranial nerve deficit noted. PSYCHIATRIC: Normal mood and affect. Normal behavior. Normal judgment and  thought content. PELVIC: deferred MUSCULOSKELETAL: Normal range of motion. No edema noted.  Labs and Imaging   Assessment & Plan:  1. Cyst of left ovary Reviewed that ovarian cyst/mass has features suspicious for malignancy and should be further investigated with plan for removal. MRI ordered, still awaiting insurance approval. Ca 125 was normal which is encouraging. Patient verbalizes understanding that if MRI is further suspicious for malignancy, will refer her to [facility] for management.   2. Abnormal uterine bleeding (AUB) Bleeding  has been improved, still needing IV iron infusions. Would like hysterectomy and will plan for same pending further investigation of ovarian mass3  3. Intramural and submucous leiomyoma of uterus See above    Routine preventative health maintenance measures emphasized. Please refer to After Visit Summary for other counseling recommendations.   Return in about 1 month (around 04/22/2018).   Feliz Beam, M.D. [facility] for [facility]

## 2018-03-26 ENCOUNTER — Encounter: Payer: Self-pay | Admitting: Obstetrics and Gynecology

## 2018-04-04 ENCOUNTER — Encounter (HOSPITAL_BASED_OUTPATIENT_CLINIC_OR_DEPARTMENT_OTHER)
Admission: RE | Admit: 2018-04-04 | Discharge: 2018-04-04 | Disposition: A | Discharge status: Home / Self Care | Payer: 59 | Source: Ambulatory Visit | Attending: General Surgery | Admitting: General Surgery

## 2018-04-04 ENCOUNTER — Other Ambulatory Visit: Payer: Self-pay

## 2018-04-04 ENCOUNTER — Encounter (HOSPITAL_BASED_OUTPATIENT_CLINIC_OR_DEPARTMENT_OTHER): Payer: Self-pay | Admitting: *Deleted

## 2018-04-04 DIAGNOSIS — Z01812 Encounter for preprocedural laboratory examination: Secondary | ICD-10-CM | POA: Diagnosis not present

## 2018-04-04 DIAGNOSIS — I10 Essential (primary) hypertension: Secondary | ICD-10-CM | POA: Insufficient documentation

## 2018-04-04 DIAGNOSIS — E119 Type 2 diabetes mellitus without complications: Secondary | ICD-10-CM | POA: Diagnosis not present

## 2018-04-04 DIAGNOSIS — Z01810 Encounter for preprocedural cardiovascular examination: Secondary | ICD-10-CM | POA: Diagnosis present

## 2018-04-04 LAB — COMPREHENSIVE METABOLIC PANEL
ALK PHOS: 47 U/L (ref 38–126)
ALT: 23 U/L (ref 0–44)
AST: 28 U/L (ref 15–41)
Albumin: 3.4 g/dL — ABNORMAL LOW (ref 3.5–5.0)
Anion gap: 10 (ref 5–15)
BILIRUBIN TOTAL: 0.4 mg/dL (ref 0.3–1.2)
BUN: 13 mg/dL (ref 6–20)
CALCIUM: 9.3 mg/dL (ref 8.9–10.3)
CO2: 28 mmol/L (ref 22–32)
CREATININE: 1.31 mg/dL — AB (ref 0.44–1.00)
Chloride: 103 mmol/L (ref 98–111)
GFR calc Af Amer: 55 mL/min — ABNORMAL LOW (ref >60)
GFR calc non Af Amer: 48 mL/min — ABNORMAL LOW (ref >60)
GLUCOSE: 157 mg/dL — AB (ref 70–99)
Potassium: 3.2 mmol/L — ABNORMAL LOW (ref 3.5–5.1)
Sodium: 141 mmol/L (ref 135–145)
TOTAL PROTEIN: 6.8 g/dL (ref 6.5–8.1)

## 2018-04-04 LAB — CBC WITH DIFFERENTIAL/PLATELET
Abs Immature Granulocytes: 0 10*3/uL (ref 0.0–0.1)
Basophils Absolute: 0 10*3/uL (ref 0.0–0.1)
Basophils Relative: 0 %
Eosinophils Absolute: 0.1 10*3/uL (ref 0.0–0.7)
Eosinophils Relative: 1 %
HEMATOCRIT: 29 % — AB (ref 36.0–46.0)
HEMOGLOBIN: 9.8 g/dL — AB (ref 12.0–15.0)
IMMATURE GRANULOCYTES: 1 %
LYMPHS ABS: 2 10*3/uL (ref 0.7–4.0)
LYMPHS PCT: 37 %
MCH: 30.1 pg (ref 26.0–34.0)
MCHC: 33.8 g/dL (ref 30.0–36.0)
MCV: 89 fL (ref 78.0–100.0)
Monocytes Absolute: 0.3 10*3/uL (ref 0.1–1.0)
Monocytes Relative: 5 %
NEUTROS PCT: 56 %
Neutro Abs: 3.1 10*3/uL (ref 1.7–7.7)
Platelets: 259 10*3/uL (ref 150–400)
RBC: 3.26 MIL/uL — AB (ref 3.87–5.11)
RDW: 14.6 % (ref 11.5–15.5)
WBC: 5.5 10*3/uL (ref 4.0–10.5)

## 2018-04-04 NOTE — Progress Notes (Signed)
Ensure pre surgery drink given with instructions to complete by [facility], pt verbalized understanding.

## 2018-04-05 NOTE — H&P (Signed)
Renee Alexander Location: [facility] Patient #: [id number] DOB: [birth year] Single / Language: Cleophus Molt / Race: Black or African American Female        History of Present Illness        . This is a 47 year old female, referred by Dr. Faylene Kurtz at the [facility] intraductal papilloma and nipple discharge left breast.      No prior history of breast problems. 6-8 month history of clear nipple discharge on the left. Sometimes elicited is sometimes spontaneous. Never saw blood. Mammograms and ultrasound showed category B density. Small mass or distortion far posterior upper outer quadrant on the left was biopsied and showed PAS H. Intraductal mass left retroareolar area at 12:00 filling to branching areas may be as large as 3.5 cm only part of which had color-flow. This shows papilloma without atypia Ultrasound of the left axilla is negative      Past history is positive for non-insulin-dependent diabetes mellitus. Hypertension. BMI 39. Family history reveals sister was diagnosed with breast cancer. A survivor. Reportedly genetic testing negative. Mother was drug abuse or HIV positive and is deceased. Father deceased had diabetes and hypertension social history reveals she is single with 6 children. Denies tobacco. Alcohol rare. Works as a Radiation protection practitioner for [employer]       I explained all of the radiographic findings and all of the pathologic findings with her. I drew extensive pictures. I told her that the papilloma was most likely benign but that there was at least a 5% chance of cancer right now. I told her the nipple discharge would probably persist due to the presence of the papilloma. She wants this area excised which is reasonable      She'll be scheduled for left breast lumpectomy, subareolar dissection, radioactive seed localization. I discussed the indications, details, techniques, and numerous risk of the surgery. She is  aware of the risk of bleeding, infection, cosmetic deformity, reoperated if cancer. Chronic pain. The nipple may be insensate. The nipple may be flattened. She understands all these issues. All her questions were answered. She agrees with this plan.   Past Surgical History  Breast Biopsy  Left. Cesarean Section - 1  Oral Surgery   Diagnostic Studies History  Colonoscopy  never Mammogram  within last year Pap Smear  1-5 years ago  Allergies  Penicillin G Benzathine & Proc *PENICILLINS*   Medication History  AmLODIPine Besylate (10MG  Tablet, Oral) Active. K-Tab [facility] Tablet ER, Oral) Active. Losartan Potassium (100MG  Tablet, Oral) Active. MetFORMIN HCl (500MG  Tablet, Oral) Active. Metoprolol Succinate ER (25MG  Tablet ER 24HR, Oral) Active. Pravastatin Sodium (20MG  Tablet, Oral) Active. ProAir HFA (108 (90 Base)MCG/ACT Aerosol Soln, Inhalation) Active. HydroCHLOROthiazide (25MG  Tablet, Oral) Active. Potassium Chloride ER (10MEQ Tablet ER, Oral) Active.  Social History  Alcohol use  Remotely quit alcohol use. Caffeine use  Carbonated beverages, Coffee, Tea. No drug use  Tobacco use  Never smoker.  Family History  Alcohol Abuse  Father, Mother. Breast Cancer  Sister. Diabetes Mellitus  Family Members In General, Father, Sister. Hypertension  Father. Thyroid problems  Family Members In General.  Pregnancy / Birth History  Age at menarche  31 years. Gravida  8 Irregular periods  Maternal age  17-20 Para  6  Other Problems  Asthma  Diabetes Mellitus  Gastroesophageal Reflux Disease  High blood pressure  Lump In Breast     Review of Systems  General Not Present- Appetite Loss, Chills, Fatigue, Fever,  Night Sweats, Weight Gain and Weight Loss. Skin Not Present- Change in Wart/Mole, Dryness, Hives, Jaundice, New Lesions, Non-Healing Wounds, Rash and Ulcer. HEENT Present- Seasonal Allergies and Wears glasses/contact lenses. Not  Present- Earache, Hearing Loss, Hoarseness, Nose Bleed, Oral Ulcers, Ringing in the Ears, Sinus Pain, Sore Throat, Visual Disturbances and Yellow Eyes. Breast Present- Breast Mass and Nipple Discharge. Not Present- Breast Pain and Skin Changes. Cardiovascular Present- Swelling of Extremities. Not Present- Chest Pain, Difficulty Breathing Lying Down, Leg Cramps, Palpitations, Rapid Heart Rate and Shortness of Breath. Gastrointestinal Present- Indigestion. Not Present- Abdominal Pain, Bloating, Bloody Stool, Change in Bowel Habits, Chronic diarrhea, Constipation, Difficulty Swallowing, Excessive gas, Gets full quickly at meals, Hemorrhoids, Nausea, Rectal Pain and Vomiting. Female Genitourinary Present- Nocturia. Not Present- Frequency, Painful Urination, Pelvic Pain and Urgency. Neurological Not Present- Decreased Memory, Fainting, Headaches, Numbness, Seizures, Tingling, Tremor, Trouble walking and Weakness. Psychiatric Not Present- Anxiety, Bipolar, Change in Sleep Pattern, Depression, Fearful and Frequent crying. Endocrine Present- New Diabetes. Not Present- Cold Intolerance, Excessive Hunger, Hair Changes, Heat Intolerance and Hot flashes. Hematology Not Present- Blood Thinners, Easy Bruising, Excessive bleeding, Gland problems, HIV and Persistent Infections.  Vitals  Weight: 228.13 lb Height: 64in Body Surface Area: 2.07 m Body Mass Index: 39.16 kg/m  Temp.: 98.40F  Pulse: 100 (Regular)  Resp.: 18 (Unlabored)  P.OX: 98% (Room air) BP: 130/82 (Sitting, Left Arm, Standard)     Physical Exam  General Mental Status-Alert. General Appearance-Consistent with stated age. Hydration-Well hydrated. Voice-Normal. Note: BMI 39   Head and Neck Head-normocephalic, atraumatic with no lesions or palpable masses. Trachea-midline. Thyroid Gland Characteristics - normal size and consistency.  Eye Eyeball - Bilateral-Extraocular movements intact. Sclera/Conjunctiva  - Bilateral-No scleral icterus.  Chest and Lung Exam Chest and lung exam reveals -quiet, even and easy respiratory effort with no use of accessory muscles and on auscultation, normal breath sounds, no adventitious sounds and normal vocal resonance. Inspection Chest Wall - Normal. Back - normal.  Breast Note: Breasts are large. Mild diffuse lumpiness. Nipple and areola home both sides looked normal. I could not elicit a nipple discharge with compression on the left. There is no axillary adenopathy.   Cardiovascular Cardiovascular examination reveals -normal heart sounds, regular rate and rhythm with no murmurs and normal pedal pulses bilaterally.  Abdomen Inspection Inspection of the abdomen reveals - No Hernias. Skin - Scar - no surgical scars. Palpation/Percussion Palpation and Percussion of the abdomen reveal - Soft, Non Tender, No Rebound tenderness, No Rigidity (guarding) and No hepatosplenomegaly. Auscultation Auscultation of the abdomen reveals - Bowel sounds normal.  Neurologic Neurologic evaluation reveals -alert and oriented x 3 with no impairment of recent or remote memory. Mental Status-Normal.  Musculoskeletal Normal Exam - Left-Upper Extremity Strength Normal and Lower Extremity Strength Normal. Normal Exam - Right-Upper Extremity Strength Normal and Lower Extremity Strength Normal.  Lymphatic Head & Neck  General Head & Neck Lymphatics: Bilateral - Description - Normal. Axillary  General Axillary Region: Bilateral - Description - Normal. Tenderness - Non Tender. Femoral & Inguinal  Generalized Femoral & Inguinal Lymphatics: Bilateral - Description - Normal. Tenderness - Non Tender.    Assessment & Plan  INTRADUCTAL PAPILLOMA OF BREAST, LEFT (D24.2)   In the left breast, upper outer quadrant, far posterior there was an area of asymmetry. 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 Nothing further needs to be  done about this  In the left breast, retroareolar area there is a mass within the milk ducts. This probably is  causing the nipple discharge. Biopsy shows intraductal papilloma This is most likely benign but there is a small chance of cancer here, probably between 5 and 10%  We have discussed this. 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 Dalbert Batman, M.D., [facility] General and Minimally invasive Surgery Breast and Colorectal Surgery Office:   [phone] Pager:   [phone]

## 2018-04-07 NOTE — Progress Notes (Signed)
Spoke with Renee Alexander at Dr. Darrel Hoover office to inform of pre op lab testing Hgb of 9.8 which Dalbert Batman ordered.  States she will notify Dalbert Batman.

## 2018-04-07 NOTE — Progress Notes (Signed)
Spoke with Renee Alexander from Dr. Darrel Hoover office who states she made [facility] aware of Hgb results and due to the chronic nature of her condition he will proceed with surgery as planned, no further orders received.

## 2018-04-08 ENCOUNTER — Ambulatory Visit
Admission: RE | Admit: 2018-04-08 | Discharge: 2018-04-08 | Disposition: A | Discharge status: Home / Self Care | Payer: 59 | Source: Ambulatory Visit | Attending: General Surgery | Admitting: General Surgery

## 2018-04-08 DIAGNOSIS — D242 Benign neoplasm of left breast: Secondary | ICD-10-CM

## 2018-04-09 ENCOUNTER — Ambulatory Visit
Admission: RE | Admit: 2018-04-09 | Discharge: 2018-04-09 | Disposition: A | Discharge status: Home / Self Care | Payer: 59 | Source: Ambulatory Visit | Attending: General Surgery | Admitting: General Surgery

## 2018-04-09 ENCOUNTER — Encounter (HOSPITAL_BASED_OUTPATIENT_CLINIC_OR_DEPARTMENT_OTHER): Payer: Self-pay | Admitting: *Deleted

## 2018-04-09 ENCOUNTER — Telehealth: Payer: Self-pay | Admitting: *Deleted

## 2018-04-09 ENCOUNTER — Other Ambulatory Visit: Payer: Self-pay

## 2018-04-09 ENCOUNTER — Encounter (HOSPITAL_BASED_OUTPATIENT_CLINIC_OR_DEPARTMENT_OTHER)
Admission: RE | Disposition: A | Discharge status: Home / Self Care | Payer: Self-pay | Source: Ambulatory Visit | Attending: General Surgery

## 2018-04-09 ENCOUNTER — Ambulatory Visit (HOSPITAL_BASED_OUTPATIENT_CLINIC_OR_DEPARTMENT_OTHER)
Admission: RE | Admit: 2018-04-09 | Discharge: 2018-04-09 | Disposition: A | Discharge status: Home / Self Care | Payer: 59 | Source: Ambulatory Visit | Attending: General Surgery | Admitting: General Surgery

## 2018-04-09 ENCOUNTER — Ambulatory Visit (HOSPITAL_BASED_OUTPATIENT_CLINIC_OR_DEPARTMENT_OTHER): Payer: 59 | Admitting: Anesthesiology

## 2018-04-09 DIAGNOSIS — Z880 Allergy status to penicillin: Secondary | ICD-10-CM | POA: Insufficient documentation

## 2018-04-09 DIAGNOSIS — Z6838 Body mass index (BMI) 38.0-38.9, adult: Secondary | ICD-10-CM | POA: Diagnosis not present

## 2018-04-09 DIAGNOSIS — E119 Type 2 diabetes mellitus without complications: Secondary | ICD-10-CM | POA: Diagnosis not present

## 2018-04-09 DIAGNOSIS — N6452 Nipple discharge: Secondary | ICD-10-CM | POA: Diagnosis not present

## 2018-04-09 DIAGNOSIS — D242 Benign neoplasm of left breast: Secondary | ICD-10-CM

## 2018-04-09 DIAGNOSIS — K219 Gastro-esophageal reflux disease without esophagitis: Secondary | ICD-10-CM | POA: Insufficient documentation

## 2018-04-09 DIAGNOSIS — I10 Essential (primary) hypertension: Secondary | ICD-10-CM | POA: Insufficient documentation

## 2018-04-09 DIAGNOSIS — E669 Obesity, unspecified: Secondary | ICD-10-CM | POA: Insufficient documentation

## 2018-04-09 DIAGNOSIS — Z7984 Long term (current) use of oral hypoglycemic drugs: Secondary | ICD-10-CM | POA: Insufficient documentation

## 2018-04-09 HISTORY — DX: Benign neoplasm of left breast: D24.2

## 2018-04-09 HISTORY — DX: Gastro-esophageal reflux disease without esophagitis: K21.9

## 2018-04-09 HISTORY — PX: BREAST LUMPECTOMY WITH RADIOACTIVE SEED LOCALIZATION: SHX6424

## 2018-04-09 LAB — GLUCOSE, CAPILLARY
Glucose-Capillary: 216 mg/dL — ABNORMAL HIGH (ref 70–99)
Glucose-Capillary: 126 mg/dL — ABNORMAL HIGH (ref 70–99)

## 2018-04-09 SURGERY — BREAST LUMPECTOMY WITH RADIOACTIVE SEED LOCALIZATION
Anesthesia: General | Site: Breast | Laterality: Left

## 2018-04-09 MED ORDER — ACETAMINOPHEN 500 MG PO TABS
ORAL_TABLET | ORAL | Status: AC
  Filled 2018-04-09: qty 2

## 2018-04-09 MED ORDER — CHLORHEXIDINE GLUCONATE CLOTH 2 % EX PADS: 6.0000 | MEDICATED_PAD | Freq: Once | CUTANEOUS | Status: DC

## 2018-04-09 MED ORDER — FENTANYL CITRATE (PF) 100 MCG/2ML IJ SOLN: 25.0000 ug | INTRAMUSCULAR | Status: DC | PRN

## 2018-04-09 MED ORDER — HYDROCODONE-ACETAMINOPHEN 5-325 MG PO TABS: 1.0000 | ORAL_TABLET | Freq: Four times a day (QID) | ORAL | 0 refills | Status: DC | PRN

## 2018-04-09 MED ORDER — ACETAMINOPHEN 325 MG PO TABS: 650.0000 mg | ORAL_TABLET | ORAL | Status: DC | PRN

## 2018-04-09 MED ORDER — CEFAZOLIN SODIUM-DEXTROSE 2-4 GM/100ML-% IV SOLN
2.0000 g | INTRAVENOUS | Status: AC
  Administered 2018-04-09: 2 g via INTRAVENOUS

## 2018-04-09 MED ORDER — CELECOXIB 200 MG PO CAPS
ORAL_CAPSULE | ORAL | Status: AC
  Filled 2018-04-09: qty 1

## 2018-04-09 MED ORDER — ONDANSETRON HCL 4 MG/2ML IJ SOLN
INTRAMUSCULAR | Status: AC
  Filled 2018-04-09: qty 2

## 2018-04-09 MED ORDER — ONDANSETRON HCL 4 MG/2ML IJ SOLN
INTRAMUSCULAR | Status: DC | PRN
  Administered 2018-04-09: 4 mg via INTRAVENOUS

## 2018-04-09 MED ORDER — ACETAMINOPHEN 650 MG RE SUPP: 650.0000 mg | RECTAL | Status: DC | PRN

## 2018-04-09 MED ORDER — PROMETHAZINE HCL 25 MG/ML IJ SOLN: 6.2500 mg | INTRAMUSCULAR | Status: DC | PRN

## 2018-04-09 MED ORDER — PROPOFOL 500 MG/50ML IV EMUL
INTRAVENOUS | Status: AC
  Filled 2018-04-09: qty 50

## 2018-04-09 MED ORDER — FENTANYL CITRATE (PF) 100 MCG/2ML IJ SOLN
INTRAMUSCULAR | Status: AC
  Filled 2018-04-09: qty 2

## 2018-04-09 MED ORDER — BUPIVACAINE-EPINEPHRINE (PF) 0.5% -1:200000 IJ SOLN
INTRAMUSCULAR | Status: DC | PRN
  Administered 2018-04-09: 10 mL via PERINEURAL

## 2018-04-09 MED ORDER — ACETAMINOPHEN 500 MG PO TABS
1000.0000 mg | ORAL_TABLET | ORAL | Status: AC
  Administered 2018-04-09: 1000 mg via ORAL

## 2018-04-09 MED ORDER — GABAPENTIN 300 MG PO CAPS
300.0000 mg | ORAL_CAPSULE | ORAL | Status: AC
  Administered 2018-04-09: 300 mg via ORAL

## 2018-04-09 MED ORDER — SODIUM CHLORIDE 0.9 % IV SOLN: 250.0000 mL | INTRAVENOUS | Status: DC | PRN

## 2018-04-09 MED ORDER — MIDAZOLAM HCL 2 MG/2ML IJ SOLN
1.0000 mg | INTRAMUSCULAR | Status: DC | PRN
  Administered 2018-04-09: 2 mg via INTRAVENOUS

## 2018-04-09 MED ORDER — OXYCODONE HCL 5 MG PO TABS: 5.0000 mg | ORAL_TABLET | ORAL | Status: DC | PRN

## 2018-04-09 MED ORDER — DEXAMETHASONE SODIUM PHOSPHATE 4 MG/ML IJ SOLN
INTRAMUSCULAR | Status: DC | PRN
  Administered 2018-04-09: 5 mg via INTRAVENOUS

## 2018-04-09 MED ORDER — SODIUM CHLORIDE 0.9% FLUSH: 3.0000 mL | Freq: Two times a day (BID) | INTRAVENOUS | Status: DC

## 2018-04-09 MED ORDER — GABAPENTIN 300 MG PO CAPS
ORAL_CAPSULE | ORAL | Status: AC
  Filled 2018-04-09: qty 1

## 2018-04-09 MED ORDER — DEXAMETHASONE SODIUM PHOSPHATE 10 MG/ML IJ SOLN
INTRAMUSCULAR | Status: AC
  Filled 2018-04-09: qty 1

## 2018-04-09 MED ORDER — BUPIVACAINE-EPINEPHRINE (PF) 0.5% -1:200000 IJ SOLN
INTRAMUSCULAR | Status: AC
  Filled 2018-04-09: qty 30

## 2018-04-09 MED ORDER — MIDAZOLAM HCL 2 MG/2ML IJ SOLN
INTRAMUSCULAR | Status: AC
  Filled 2018-04-09: qty 2

## 2018-04-09 MED ORDER — LACTATED RINGERS IV SOLN
INTRAVENOUS | Status: DC
  Administered 2018-04-09: 08:00:00 via INTRAVENOUS

## 2018-04-09 MED ORDER — PROPOFOL 10 MG/ML IV BOLUS
INTRAVENOUS | Status: DC | PRN
  Administered 2018-04-09: 170 mg via INTRAVENOUS

## 2018-04-09 MED ORDER — BUPIVACAINE HCL (PF) 0.25 % IJ SOLN
INTRAMUSCULAR | Status: AC
  Filled 2018-04-09: qty 30

## 2018-04-09 MED ORDER — LIDOCAINE 2% (20 MG/ML) 5 ML SYRINGE
INTRAMUSCULAR | Status: AC
  Filled 2018-04-09: qty 5

## 2018-04-09 MED ORDER — BUPIVACAINE HCL (PF) 0.5 % IJ SOLN
INTRAMUSCULAR | Status: AC
  Filled 2018-04-09: qty 30

## 2018-04-09 MED ORDER — SODIUM CHLORIDE 0.9% FLUSH: 3.0000 mL | INTRAVENOUS | Status: DC | PRN

## 2018-04-09 MED ORDER — LIDOCAINE HCL (CARDIAC) PF 100 MG/5ML IV SOSY
PREFILLED_SYRINGE | INTRAVENOUS | Status: DC | PRN
  Administered 2018-04-09: 100 mg via INTRAVENOUS

## 2018-04-09 MED ORDER — SCOPOLAMINE 1 MG/3DAYS TD PT72: 1.0000 | MEDICATED_PATCH | Freq: Once | TRANSDERMAL | Status: DC | PRN

## 2018-04-09 MED ORDER — CEFAZOLIN SODIUM-DEXTROSE 2-4 GM/100ML-% IV SOLN
INTRAVENOUS | Status: AC
  Filled 2018-04-09: qty 100

## 2018-04-09 MED ORDER — FENTANYL CITRATE (PF) 100 MCG/2ML IJ SOLN
50.0000 ug | INTRAMUSCULAR | Status: DC | PRN
  Administered 2018-04-09: 100 ug via INTRAVENOUS

## 2018-04-09 MED ORDER — CELECOXIB 200 MG PO CAPS
200.0000 mg | ORAL_CAPSULE | ORAL | Status: AC
  Administered 2018-04-09: 200 mg via ORAL

## 2018-04-09 MED ORDER — LACTATED RINGERS IV SOLN: INTRAVENOUS | Status: DC

## 2018-04-09 SURGICAL SUPPLY — 63 items
APPLIER CLIP 9.375 MED OPEN (MISCELLANEOUS)
BENZOIN TINCTURE PRP APPL 2/3 (GAUZE/BANDAGES/DRESSINGS) IMPLANT
BINDER BREAST 3XL (GAUZE/BANDAGES/DRESSINGS) ×3 IMPLANT
BINDER BREAST LRG (GAUZE/BANDAGES/DRESSINGS) IMPLANT
BINDER BREAST MEDIUM (GAUZE/BANDAGES/DRESSINGS) IMPLANT
BINDER BREAST XLRG (GAUZE/BANDAGES/DRESSINGS) IMPLANT
BINDER BREAST XXLRG (GAUZE/BANDAGES/DRESSINGS) IMPLANT
BLADE HEX COATED 2.75 (ELECTRODE) ×3 IMPLANT
BLADE SURG 10 STRL SS (BLADE) IMPLANT
BLADE SURG 15 STRL LF DISP TIS (BLADE) ×1 IMPLANT
BLADE SURG 15 STRL SS (BLADE) ×2
CANISTER SUC SOCK COL 7IN (MISCELLANEOUS) IMPLANT
CANISTER SUCT 1200ML W/VALVE (MISCELLANEOUS) ×3 IMPLANT
CHLORAPREP W/TINT 26ML (MISCELLANEOUS) ×3 IMPLANT
CLIP APPLIE 9.375 MED OPEN (MISCELLANEOUS) IMPLANT
CLOSURE WOUND 1/2 X4 (GAUZE/BANDAGES/DRESSINGS)
COVER BACK TABLE 60X90IN (DRAPES) ×3 IMPLANT
COVER MAYO STAND STRL (DRAPES) ×3 IMPLANT
COVER PROBE W GEL 5X96 (DRAPES) ×3 IMPLANT
DECANTER SPIKE VIAL GLASS SM (MISCELLANEOUS) IMPLANT
DERMABOND ADVANCED (GAUZE/BANDAGES/DRESSINGS) ×2
DERMABOND ADVANCED .7 DNX12 (GAUZE/BANDAGES/DRESSINGS) ×1 IMPLANT
DEVICE DUBIN W/COMP PLATE 8390 (MISCELLANEOUS) ×3 IMPLANT
DRAPE LAPAROSCOPIC ABDOMINAL (DRAPES) ×3 IMPLANT
DRAPE UTILITY XL STRL (DRAPES) ×3 IMPLANT
DRSG PAD ABDOMINAL 8X10 ST (GAUZE/BANDAGES/DRESSINGS) IMPLANT
ELECT REM PT RETURN 9FT ADLT (ELECTROSURGICAL) ×3
ELECTRODE REM PT RTRN 9FT ADLT (ELECTROSURGICAL) ×1 IMPLANT
GAUZE SPONGE 4X4 12PLY STRL LF (GAUZE/BANDAGES/DRESSINGS) IMPLANT
GLOVE BIOGEL PI IND STRL 7.0 (GLOVE) ×1 IMPLANT
GLOVE BIOGEL PI INDICATOR 7.0 (GLOVE) ×2
GLOVE ECLIPSE 6.5 STRL STRAW (GLOVE) ×6 IMPLANT
GLOVE EUDERMIC 7 POWDERFREE (GLOVE) ×3 IMPLANT
GOWN STRL REUS W/ TWL LRG LVL3 (GOWN DISPOSABLE) ×1 IMPLANT
GOWN STRL REUS W/ TWL XL LVL3 (GOWN DISPOSABLE) ×1 IMPLANT
GOWN STRL REUS W/TWL LRG LVL3 (GOWN DISPOSABLE) ×2
GOWN STRL REUS W/TWL XL LVL3 (GOWN DISPOSABLE) ×2
ILLUMINATOR WAVEGUIDE N/F (MISCELLANEOUS) IMPLANT
KIT MARKER MARGIN INK (KITS) ×3 IMPLANT
LIGHT WAVEGUIDE WIDE FLAT (MISCELLANEOUS) IMPLANT
NEEDLE HYPO 25X1 1.5 SAFETY (NEEDLE) ×3 IMPLANT
NS IRRIG 1000ML POUR BTL (IV SOLUTION) ×3 IMPLANT
PACK BASIN DAY SURGERY FS (CUSTOM PROCEDURE TRAY) ×3 IMPLANT
PENCIL BUTTON HOLSTER BLD 10FT (ELECTRODE) ×3 IMPLANT
SHEET MEDIUM DRAPE 40X70 STRL (DRAPES) IMPLANT
SLEEVE SCD COMPRESS KNEE MED (MISCELLANEOUS) ×3 IMPLANT
SPONGE LAP 18X18 RF (DISPOSABLE) IMPLANT
SPONGE LAP 4X18 RFD (DISPOSABLE) ×3 IMPLANT
STRIP CLOSURE SKIN 1/2X4 (GAUZE/BANDAGES/DRESSINGS) IMPLANT
SUT ETHILON 3 0 FSL (SUTURE) IMPLANT
SUT MNCRL AB 4-0 PS2 18 (SUTURE) ×3 IMPLANT
SUT SILK 2 0 SH (SUTURE) ×3 IMPLANT
SUT VIC AB 2-0 CT1 27 (SUTURE)
SUT VIC AB 2-0 CT1 TAPERPNT 27 (SUTURE) IMPLANT
SUT VIC AB 3-0 SH 27 (SUTURE)
SUT VIC AB 3-0 SH 27X BRD (SUTURE) IMPLANT
SUT VICRYL 3-0 CR8 SH (SUTURE) ×3 IMPLANT
SYR 10ML LL (SYRINGE) ×3 IMPLANT
TOWEL GREEN STERILE FF (TOWEL DISPOSABLE) ×3 IMPLANT
TOWEL OR NON WOVEN STRL DISP B (DISPOSABLE) IMPLANT
TUBE CONNECTING 20'X1/4 (TUBING) ×1
TUBE CONNECTING 20X1/4 (TUBING) ×2 IMPLANT
YANKAUER SUCT BULB TIP NO VENT (SUCTIONS) ×3 IMPLANT

## 2018-04-09 NOTE — Interval H&P Note (Signed)
History and Physical Interval Note:  04/09/2018 7:40 AM  Dufur  has presented today for surgery, with the diagnosis of LEFT BREAST PAPILLOMA  The various methods of treatment have been discussed with the patient and family. After consideration of risks, benefits and other options for treatment, the patient has consented to  Procedure(s): BREAST LUMPECTOMY WITH RADIOACTIVE SEED LOCALIZATION (Left) as a surgical intervention .  The patient's history has been reviewed, patient examined, no change in status, stable for surgery.  I have reviewed the patient's chart and labs.  Questions were answered to the patient's satisfaction.     Adin Hector

## 2018-04-09 NOTE — Transfer of Care (Signed)
Immediate Anesthesia Transfer of Care Note  Patient: Renee Alexander  Procedure(s) Performed: BREAST LUMPECTOMY WITH RADIOACTIVE SEED LOCALIZATION (Left Breast)  Patient Location: PACU  Anesthesia Type:General  Level of Consciousness: awake, sedated and patient cooperative  Airway & Oxygen Therapy: Patient Spontanous Breathing  Post-op Assessment: Report given to RN and Post -op Vital signs reviewed and stable  Post vital signs: Reviewed and stable  Last Vitals:  Vitals Value Taken Time  BP 146/80 04/09/2018  9:17 AM  Temp    Pulse 70 04/09/2018  9:19 AM  Resp 17 04/09/2018  9:19 AM  SpO2 95 % 04/09/2018  9:19 AM  Vitals shown include unvalidated device data.  Last Pain:  Vitals:   04/09/18 0706  TempSrc: Oral  PainSc: 0-No pain      Patients Stated Pain Goal: 0 (75/44/92 0100)  Complications: No apparent anesthesia complications

## 2018-04-09 NOTE — Anesthesia Postprocedure Evaluation (Signed)
Anesthesia Post Note  Patient: Renee Alexander  Procedure(s) Performed: BREAST LUMPECTOMY WITH RADIOACTIVE SEED LOCALIZATION (Left Breast)     Patient location during evaluation: PACU Anesthesia Type: General Level of consciousness: awake and alert, oriented and awake Pain management: pain level controlled Vital Signs Assessment: post-procedure vital signs reviewed and stable Respiratory status: spontaneous breathing, nonlabored ventilation and respiratory function stable Cardiovascular status: blood pressure returned to baseline and stable Postop Assessment: no apparent nausea or vomiting Anesthetic complications: no    Last Vitals:  Vitals:   04/09/18 1030 04/09/18 1058  BP: (!) 180/80 (!) 169/81  Pulse: 76   Resp: 20   Temp: 37.1 C   SpO2: 99%     Last Pain:  Vitals:   04/09/18 1030  TempSrc: Oral  PainSc: 0-No pain                 Catalina Gravel

## 2018-04-09 NOTE — Anesthesia Preprocedure Evaluation (Addendum)
Anesthesia Evaluation  Patient identified by MRN, date of birth, ID band Patient awake    Reviewed: Allergy & Precautions, NPO status , Patient's Chart, lab work & pertinent test results, reviewed documented beta blocker date and time   Airway Mallampati: II  TM Distance: >3 FB Neck ROM: Full    Dental  (+) Dental Advisory Given, Missing   Pulmonary asthma ,    Pulmonary exam normal breath sounds clear to auscultation       Cardiovascular hypertension, Pt. on home beta blockers and Pt. on medications Normal cardiovascular exam Rhythm:Regular Rate:Normal     Neuro/Psych negative neurological ROS  negative psych ROS   GI/Hepatic Neg liver ROS, GERD  Medicated and Controlled,  Endo/Other  diabetes, Type 2, Oral Hypoglycemic AgentsMorbid obesity  Renal/GU Renal InsufficiencyRenal disease     Musculoskeletal negative musculoskeletal ROS (+)   Abdominal   Peds  Hematology  (+) Blood dyscrasia, anemia ,   Anesthesia Other Findings Day of surgery medications reviewed with the patient.  Reproductive/Obstetrics                            Anesthesia Physical Anesthesia Plan  ASA: III  Anesthesia Plan: General   Post-op Pain Management:    Induction: Intravenous  PONV Risk Score and Plan: 3 and Midazolam, Dexamethasone and Ondansetron  Airway Management Planned: LMA  Additional Equipment:   Intra-op Plan:   Post-operative Plan: Extubation in OR  Informed Consent: I have reviewed the patients History and Physical, chart, labs and discussed the procedure including the risks, benefits and alternatives for the proposed anesthesia with the patient or authorized representative who has indicated his/her understanding and acceptance.   Dental advisory given  Plan Discussed with: CRNA  Anesthesia Plan Comments:         Anesthesia Quick Evaluation

## 2018-04-09 NOTE — Discharge Instructions (Signed)
[facility] °Office Phone Number [phone] ° °BREAST BIOPSY/ PARTIAL MASTECTOMY: POST OP INSTRUCTIONS ° °Always review your discharge instruction sheet given to you by the facility where your surgery was performed. ° °IF YOU HAVE DISABILITY OR FAMILY LEAVE FORMS, YOU MUST BRING THEM TO THE OFFICE FOR PROCESSING.  DO NOT GIVE THEM TO YOUR DOCTOR. ° °1. A prescription for pain medication may be given to you upon discharge.  Take your pain medication as prescribed, if needed.  If narcotic pain medicine is not needed, then you may take acetaminophen (Tylenol) or ibuprofen (Advil) as needed. °2. Take your usually prescribed medications unless otherwise directed °3. If you need a refill on your pain medication, please contact your pharmacy.  They will contact our office to request authorization.  Prescriptions will not be filled after 5pm or on week-ends. °4. You should eat very light the first 24 hours after surgery, such as soup, crackers, pudding, etc.  Resume your normal diet the day after surgery. °5. Most patients will experience some swelling and bruising in the breast.  Ice packs and a good support bra will help.  Swelling and bruising can take several days to resolve.  °6. It is common to experience some constipation if taking pain medication after surgery.  Increasing fluid intake and taking a stool softener will usually help or prevent this problem from occurring.  A mild laxative (Milk of Magnesia or Miralax) should be taken according to package directions if there are no bowel movements after 48 hours. °7. Unless discharge instructions indicate otherwise, you may remove your bandages 24-48 hours after surgery, and you may shower at that time.  You may have steri-strips (small skin tapes) in place directly over the incision.  These strips should be left on the skin for 7-10 days.  If your surgeon used skin glue on the incision, you may shower in 24 hours.  The glue will flake off over the  next 2-3 weeks.  Any sutures or staples will be removed at the office during your follow-up visit. °8. ACTIVITIES:  You may resume regular daily activities (gradually increasing) beginning the next day.  Wearing a good support bra or sports bra minimizes pain and swelling.  You may have sexual intercourse when it is comfortable. °a. You may drive when you no longer are taking prescription pain medication, you can comfortably wear a seatbelt, and you can safely maneuver your car and apply brakes. °b. RETURN TO WORK:  ______________________________________________________________________________________ °9. You should see your doctor in the office for a follow-up appointment approximately two weeks after your surgery.  Your doctor’s nurse will typically make your follow-up appointment when she calls you with your pathology report.  Expect your pathology report 2-3 business days after your surgery.  You may call to check if you do not hear from us after three days. °10. OTHER INSTRUCTIONS: _______________________________________________________________________________________________ _____________________________________________________________________________________________________________________________________ °_____________________________________________________________________________________________________________________________________ °_____________________________________________________________________________________________________________________________________ ° °WHEN TO CALL YOUR DOCTOR: °1. Fever over 101.0 °2. Nausea and/or vomiting. °3. Extreme swelling or bruising. °4. Continued bleeding from incision. °5. Increased pain, redness, or drainage from the incision. ° °The clinic staff is available to answer your questions during regular business hours.  Please don’t hesitate to call and ask to speak to one of the nurses for clinical concerns.  If you have a medical emergency, go to the nearest  emergency room or call 911.  A surgeon from [facility] is always on call at the hospital. ° °For further questions, please visit [email or web address]  ° ° ° ° °  Post Anesthesia Home Care Instructions ° °Activity: °Get plenty of rest for the remainder of the day. A responsible individual must stay with you for 24 hours following the procedure.  °For the next 24 hours, DO NOT: °-Drive a car °-Operate machinery °-Drink alcoholic beverages °-Take any medication unless instructed by your physician °-Make any legal decisions or sign important papers. ° °Meals: °Start with liquid foods such as gelatin or soup. Progress to regular foods as tolerated. Avoid greasy, spicy, heavy foods. If nausea and/or vomiting occur, drink only clear liquids until the nausea and/or vomiting subsides. Call your physician if vomiting continues. ° °Special Instructions/Symptoms: °Your throat may feel dry or sore from the anesthesia or the breathing tube placed in your throat during surgery. If this causes discomfort, gargle with warm salt water. The discomfort should disappear within 24 hours. ° °If you had a scopolamine patch placed behind your ear for the management of post- operative nausea and/or vomiting: ° °1. The medication in the patch is effective for 72 hours, after which it should be removed.  Wrap patch in a tissue and discard in the trash. Wash hands thoroughly with soap and water. °2. You may remove the patch earlier than 72 hours if you experience unpleasant side effects which may include dry mouth, dizziness or visual disturbances. °3. Avoid touching the patch. Wash your hands with soap and water after contact with the patch. °  ° °

## 2018-04-09 NOTE — Anesthesia Procedure Notes (Signed)
Procedure Name: LMA Insertion Date/Time: 04/09/2018 8:30 AM Performed by: Lyndee Leo, CRNA Pre-anesthesia Checklist: Patient identified, Emergency Drugs available, Suction available and Patient being monitored Patient Re-evaluated:Patient Re-evaluated prior to induction Oxygen Delivery Method: Circle system utilized Preoxygenation: Pre-oxygenation with 100% oxygen Induction Type: IV induction Ventilation: Mask ventilation without difficulty LMA: LMA inserted LMA Size: 4.0 Number of attempts: 1 Airway Equipment and Method: Bite block Placement Confirmation: positive ETCO2 Tube secured with: Tape Dental Injury: Teeth and Oropharynx as per pre-operative assessment

## 2018-04-09 NOTE — Op Note (Signed)
Patient Name:           Renee Alexander   Date of Surgery:        04/09/2018  Pre op Diagnosis:      Intraductal papilloma and nipple discharge left breast  Post op Diagnosis:    Same  Procedure:                 Left breast lumpectomy with radioactive seed localization and margin assessment  Surgeon:                     Edsel Petrin. Dalbert Batman, M.D., FACS  Assistant:                      OR staff  Operative Indications:  This is a 47 year old female, referred by Dr. Faylene Kurtz at the Breast Ctr[facility] intraductal papilloma and nipple discharge left breast.      No prior history of breast problems. 6-8 month history of clear nipple discharge on the left. Sometimes elicited is sometimes spontaneous. Never saw blood. Mammograms and ultrasound showed category B density. Small mass or distortion far posterior upper outer quadrant on the left was biopsied and showed PAS H. Intraductal mass left retroareolar area at 12:00 filling to branching areas may be as large as 3.5 cm only part of which had color-flow. This shows papilloma without atypia Ultrasound of the left axilla is negative       I explained all of the radiographic findings and all of the pathologic findings with her. I drew extensive pictures. I told her that the papilloma was most likely benign but that there was at least a 5% chance of cancer right now. I told her the nipple discharge would probably persist due to the presence of the papilloma. She wants this area excised which is reasonable      She'll be scheduled for left breast lumpectomy, subareolar dissection, radioactive seed localization. She agrees with this plan.   Operative Findings:       The original biopsy clip and the radiographic seed were deep to the areolar margin of the left breast at about the 2 o'clock position.  The lumpectomy was performed through a circumareolar incision, hidden scar technique.  The specimen mammogram looked good containing the marker clip  and the radioactive seed in the relative center of the specimen.  There was no grossly abnormal palpable finding.  Procedure in Detail:          Following the induction of general LMA anesthesia the patient's left breast was prepped and draped in a sterile fashion.  Surgical timeout was performed.  Intravenous antibiotics were given.  0.5% Marcaine with epinephrine was used as a local infiltration anesthetic.      Neoprobe was used to plan the incision.  Superior lateral circumareolar incision was made at the areolar margin using a knife.  The lumpectomy was performed using the neoprobe and electrocautery.  The specimen was removed and marked with silk sutures and a 6 color ink kit to orient the pathologist.  The specimen mammogram looked good as described above.  Specimen was sent to the pathology lab where the seed was retrieved.  Hemostasis was excellent and achieved with electrocautery.  The wound was irrigated.  5 metal marker clips were placed in the lumpectomy cavity walls.  The breast tissues were reapproximated with interrupted sutures of 3-0 Vicryl and the skin closed with a running subcuticular 4-0 Monocryl and Dermabond.  Dry bandages and a breast binder were placed and the patient taken to PACU in stable condition.  EBL 10 to 15 cc.  Counts correct.  Complications none.    Addendum: I logged onto the [employer] and reviewed her prescription medication history     Georjean Toya M. Dalbert Batman, M.D., FACS General and Minimally Invasive Surgery Breast and Colorectal Surgery  04/09/2018 9:12 AM

## 2018-04-09 NOTE — Telephone Encounter (Signed)
Spoke with patient regarding appointments for 04/10/18. Patient stated,"I had breast surgery today, and I'm not up to coming tomorrow for lab/MD/ iron infusion. I will call to reschedule. Informed her I would cancel her appointments. She will come for the 04/17/18 iron infusion.

## 2018-04-10 ENCOUNTER — Encounter (HOSPITAL_BASED_OUTPATIENT_CLINIC_OR_DEPARTMENT_OTHER): Payer: Self-pay | Admitting: General Surgery

## 2018-04-10 ENCOUNTER — Ambulatory Visit: Payer: 59

## 2018-04-10 ENCOUNTER — Other Ambulatory Visit: Payer: 59

## 2018-04-10 ENCOUNTER — Ambulatory Visit: Payer: 59 | Admitting: Oncology

## 2018-04-10 NOTE — Progress Notes (Signed)
Inform patient of Pathology report,. Pathology shows benign papilloma. NO cancer I will discuss in detail at next office visit   hmi

## 2018-04-15 ENCOUNTER — Telehealth: Payer: Self-pay | Admitting: Oncology

## 2018-04-15 NOTE — Telephone Encounter (Signed)
Scheduled appt per 8/27 sch message - left message with appt date and time.  

## 2018-04-16 ENCOUNTER — Inpatient Hospital Stay: Payer: 59 | Attending: Oncology

## 2018-04-16 DIAGNOSIS — D509 Iron deficiency anemia, unspecified: Secondary | ICD-10-CM | POA: Insufficient documentation

## 2018-04-16 DIAGNOSIS — D500 Iron deficiency anemia secondary to blood loss (chronic): Secondary | ICD-10-CM

## 2018-04-16 LAB — CBC WITH DIFFERENTIAL (CANCER CENTER ONLY)
Basophils Absolute: 0 10*3/uL (ref 0.0–0.1)
Basophils Relative: 0 %
EOS ABS: 0.1 10*3/uL (ref 0.0–0.5)
EOS PCT: 1 %
HCT: 30.1 % — ABNORMAL LOW (ref 34.8–46.6)
Hemoglobin: 10.1 g/dL — ABNORMAL LOW (ref 11.6–15.9)
LYMPHS ABS: 1.7 10*3/uL (ref 0.9–3.3)
LYMPHS PCT: 33 %
MCH: 30 pg (ref 25.1–34.0)
MCHC: 33.5 g/dL (ref 31.5–36.0)
MCV: 89.6 fL (ref 79.5–101.0)
MONO ABS: 0.4 10*3/uL (ref 0.1–0.9)
Monocytes Relative: 7 %
Neutro Abs: 2.9 10*3/uL (ref 1.5–6.5)
Neutrophils Relative %: 59 %
PLATELETS: 211 10*3/uL (ref 145–400)
RBC: 3.36 MIL/uL — ABNORMAL LOW (ref 3.70–5.45)
RDW: 15.1 % — AB (ref 11.2–14.5)
WBC Count: 5.1 10*3/uL (ref 3.9–10.3)

## 2018-04-17 ENCOUNTER — Inpatient Hospital Stay: Payer: 59

## 2018-04-17 LAB — FERRITIN: FERRITIN: 132 ng/mL (ref 11–307)

## 2018-04-17 LAB — IRON AND TIBC
IRON: 72 ug/dL (ref 41–142)
Saturation Ratios: 29 % (ref 21–57)
TIBC: 248 ug/dL (ref 236–444)
UIBC: 176 ug/dL

## 2018-04-17 NOTE — Progress Notes (Signed)
Per Dr Alen Blew no Feraheme today r/t labs.

## 2018-05-01 ENCOUNTER — Telehealth: Payer: Self-pay

## 2018-05-01 NOTE — Telephone Encounter (Signed)
The patient was called in order to schedule a procedure.  The patient did not answer the phone, so I left a voicemail asking her to call the office at her earliest convenience so the procedure can be scheduled.

## 2018-05-09 ENCOUNTER — Ambulatory Visit (HOSPITAL_COMMUNITY)
Admission: RE | Admit: 2018-05-09 | Discharge: 2018-05-09 | Disposition: A | Discharge status: Home / Self Care | Payer: 59 | Source: Ambulatory Visit | Attending: Obstetrics and Gynecology | Admitting: Obstetrics and Gynecology

## 2018-05-09 DIAGNOSIS — D259 Leiomyoma of uterus, unspecified: Secondary | ICD-10-CM | POA: Insufficient documentation

## 2018-05-09 DIAGNOSIS — Z3481 Encounter for supervision of other normal pregnancy, first trimester: Secondary | ICD-10-CM

## 2018-05-09 DIAGNOSIS — N949 Unspecified condition associated with female genital organs and menstrual cycle: Secondary | ICD-10-CM | POA: Diagnosis present

## 2018-05-09 MED ORDER — GADOBUTROL 1 MMOL/ML IV SOLN
10.0000 mL | Freq: Once | INTRAVENOUS | Status: AC | PRN
  Administered 2018-05-09: 10 mL via INTRAVENOUS

## 2018-05-14 ENCOUNTER — Telehealth: Payer: Self-pay

## 2018-05-14 NOTE — Telephone Encounter (Signed)
Called pt to discuss paperwork, left vm

## 2018-05-15 ENCOUNTER — Telehealth: Payer: Self-pay

## 2018-05-15 NOTE — Telephone Encounter (Signed)
Pt called in to discuss paperwork.

## 2018-05-16 ENCOUNTER — Telehealth: Payer: Self-pay | Admitting: Obstetrics and Gynecology

## 2018-05-16 NOTE — Telephone Encounter (Signed)
Called patient to review MRI results, she will make appointment for pre-op planning. Answered all questions.  Feliz Beam, M.D. [facility]

## 2018-05-22 ENCOUNTER — Telehealth: Payer: Self-pay | Admitting: *Deleted

## 2018-05-22 NOTE — Telephone Encounter (Signed)
Attempt to contact pt regarding Leave paperwork. LM on VM to call back in order to discuss correction that are needed.

## 2018-05-28 ENCOUNTER — Telehealth: Payer: Self-pay

## 2018-05-28 NOTE — Telephone Encounter (Signed)
Left VM message that FMLA Paperwork was completed, she can pick it up or give [place] Fax number.

## 2018-06-03 ENCOUNTER — Encounter: Payer: Self-pay | Admitting: *Deleted

## 2018-06-03 ENCOUNTER — Ambulatory Visit (INDEPENDENT_AMBULATORY_CARE_PROVIDER_SITE_OTHER): Payer: 59 | Admitting: Obstetrics and Gynecology

## 2018-06-03 VITALS — BP 166/90 | HR 72 | Wt 225.0 lb

## 2018-06-03 DIAGNOSIS — D251 Intramural leiomyoma of uterus: Secondary | ICD-10-CM

## 2018-06-03 DIAGNOSIS — D500 Iron deficiency anemia secondary to blood loss (chronic): Secondary | ICD-10-CM

## 2018-06-03 DIAGNOSIS — N939 Abnormal uterine and vaginal bleeding, unspecified: Secondary | ICD-10-CM | POA: Diagnosis not present

## 2018-06-03 NOTE — Progress Notes (Signed)
GYNECOLOGY OFFICE FOLLOW UP NOTE  History:  47 y.o. [id number] here today for follow up for AUB and fibroid uterus. She had period for 5 days on 9/20 and then started again 10/10.  She continues to have dysmenorrhea. She is eager for definitive management.    Past Medical History:  Diagnosis Date  . Anemia   . Asthma   . Diabetes mellitus without complication ([place])   . Genital herpes   . GERD (gastroesophageal reflux disease)   . Hypertension   . Intraductal papilloma of left breast 04/09/2018  . Papilloma of left breast     Past Surgical History:  Procedure Laterality Date  . BREAST LUMPECTOMY WITH RADIOACTIVE SEED LOCALIZATION Left 04/09/2018   Procedure: BREAST LUMPECTOMY WITH RADIOACTIVE SEED LOCALIZATION;  Surgeon: Fanny Skates, MD;  Location: [place];  Service: General;  Laterality: Left;  . CESAREAN SECTION    . TUBAL LIGATION     x2     Current Outpatient Medications:  .  albuterol (PROVENTIL HFA;VENTOLIN HFA) 108 (90 BASE) MCG/ACT inhaler, Inhale 2 puffs into the lungs every 6 (six) hours as needed for wheezing or shortness of breath. (Patient taking differently: Inhale 2 puffs into the lungs every evening. ), Disp: 1 Inhaler, Rfl: 2 .  amLODipine (NORVASC) 10 MG tablet, Take 1 tablet (10 mg total) by mouth daily., Disp: 30 tablet, Rfl: 0 .  hydrochlorothiazide (MICROZIDE) 12.5 MG capsule, Take 12.5 mg by mouth at bedtime., Disp: , Rfl: 0 .  HYDROcodone-acetaminophen (NORCO) 5-325 MG tablet, Take 1-2 tablets by mouth every 6 (six) hours as needed for moderate pain or severe pain., Disp: 20 tablet, Rfl: 0 .  losartan (COZAAR) 100 MG tablet, Take 100 mg by mouth daily., Disp: , Rfl:  .  Magnesium 250 MG TABS, Take by mouth., Disp: , Rfl:  .  metFORMIN (GLUCOPHAGE) 500 MG tablet, Take by mouth 2 (two) times daily with a meal., Disp: , Rfl:  .  Metoprolol Succinate 25 MG CS24, Take 25 mg by mouth daily., Disp: , Rfl:  .  omeprazole (PRILOSEC) 20 MG  capsule, Take 20 mg by mouth daily., Disp: , Rfl:  .  polyethylene glycol (MIRALAX / GLYCOLAX) packet, Take 17 g by mouth 2 (two) times daily., Disp: 14 each, Rfl: 0 .  Potassium Chloride ER 20 MEQ TBCR, potassium chloride ER 20 mEq tablet,extended release, Disp: , Rfl:  .  pravastatin (PRAVACHOL) 20 MG tablet, Take 20 mg by mouth daily., Disp: , Rfl:  .  triamcinolone (NASACORT ALLERGY 24HR) 55 MCG/ACT AERO nasal inhaler, Place 2 sprays into the nose daily as needed (allergies)., Disp: , Rfl:  .  lisinopril (PRINIVIL,ZESTRIL) 5 MG tablet, Take 1 tablet (5 mg total) by mouth daily., Disp: 30 tablet, Rfl: 11  The following portions of the patient's history were reviewed and updated as appropriate: allergies, current medications, past family history, past medical history, past social history, past surgical history and problem list.   Review of Systems:  Pertinent items noted in HPI and remainder of comprehensive ROS otherwise negative.   Objective:  Physical Exam BP (!) 166/90   Pulse 72   Wt 225 lb (102.1 kg)   LMP 05/29/2018   BMI 38.62 kg/m  CONSTITUTIONAL: Well-developed, well-nourished female in no acute distress.  HENT:  Normocephalic, atraumatic. External right and left ear normal. Oropharynx is clear and moist EYES: Conjunctivae and EOM are normal. Pupils are equal, round, and reactive to light. No scleral icterus.  NECK: Normal  range of motion, supple, no masses SKIN: Skin is warm and dry. No rash noted. Not diaphoretic. No erythema. No pallor. NEUROLOGIC: Alert and oriented to person, place, and time. Normal reflexes, muscle tone coordination. No cranial nerve deficit noted. PSYCHIATRIC: Normal mood and affect. Normal behavior. Normal judgment and thought content. CARDIOVASCULAR: Normal heart rate noted RESPIRATORY: Effort normal, no problems with respiration noted ABDOMEN: Soft, no distention noted, uterus mobile but palpable to 19 weeks sized PELVIC: deferred MUSCULOSKELETAL:  Normal range of motion. No edema noted.  Labs and Imaging Mr Pelvis W Wo Contrast  Result Date: 05/09/2018 CLINICAL DATA:  Follow-up complex cystic lesion in left adnexa on prior ultrasound. Fibroids. EXAM: MRI PELVIS WITHOUT AND WITH CONTRAST TECHNIQUE: Multiplanar multisequence MR imaging of the pelvis was performed both before and after administration of intravenous contrast. CONTRAST:  10 mL Gadavist COMPARISON:  Pelvic ultrasound on 02/04/2018 FINDINGS: Urinary Tract: No urinary bladder or urethral abnormality. Bowel: Unremarkable pelvic bowel loops. Vascular/Lymphatic: Unremarkable. No pathologically enlarged pelvic lymph nodes identified. Reproductive: -- Uterus: Measures 18.2 x 11.1 x 12.8 cm (volume = 1350 cm^3). Multiple intramural fibroids are seen involving the uterus diffusely, largest in the left anterior lower uterine segment measuring 8.3 cm in maximum diameter. This fibroid shows central cystic degeneration. No abnormal endometrial thickening. Normal appearance of cervix and vagina. -- Right ovary: Appears normal. 3.6 cm simple follicular cyst noted. No mass or inflammatory process identified. -- Left ovary: Appears normal. Previously seen complex cystic lesion has resolved. No mass or inflammatory process identified. Other: No peritoneal thickening or abnormal free fluid. Musculoskeletal:  Unremarkable. IMPRESSION: Resolution of complex left ovarian cystic lesion since prior study, consistent with resolving hemorrhagic cyst. Multiple uterine fibroids measuring up to 8 cm. Electronically Signed   By: Earle Gell M.D.   On: 05/09/2018 09:30   Diagnosis Endometrium, biopsy - BENIGN ENDOMETRIUM IN SECRETORY PHASE. - NEGATIVE FOR HYPERPLASIA OR MALIGNANCY. Jaquita Folds MD Pathologist, Electronic Signature (Case signed 02/27/2018)    Assessment & Plan:  1. Abnormal uterine bleeding (AUB) Reviewed definitive management for AUB and fibroid uterus, patient eager to have hysterectomy.  Reviewed recommendation for abdominal hysterectomy and that given size of uterus, would be very difficult to do minimally invasive hysterectomy. The risks of abdominal hysterectomy were discussed with the patient; including but not limited to: infection which may require antibiotics; bleeding which may require transfusion or re-operation; injury to bowel, bladder, ureters or other surrounding organs; need for additional procedures, incisional problems, thromboembolic phenomenon and other postoperative/anesthesia complications. Reviewed that with a hysterectomy, she will not be able to have children in the future. Reviewed recommendation to remove fallopian tubes to reduce risk of ovarian cancer, but as she is 40, would not recommend removal of ovaries at this time. Reviewed that ovaries may be removed if they are abnormal appearing, anatomy requires it or if there is damage to blood supply. Reviewed that removal of ovaries would put her into surgical menopause and we will attempt to avoid this if possible. Reviewed risks/benefits of keeping cervix and need for continued pap smears if she desires to keep her cervix. Reviewed that she may have monthly bleeding if there is endometrial tissue in cervix and she keeps her cervix. She would like to remove her cervix. Reviewed expected post surgery course and hospital stay.  Patient verbalized understanding of the above and consents to blood transfusion in the event of a life-threatening hemorrhage. Answered all questions.   She understands scheduling will call her to schedule  surgery and she will have evaluation by anesthesia.  2. Intramural leiomyoma of uterus See above   Routine preventative health maintenance measures emphasized. Please refer to After Visit Summary for other counseling recommendations.   Return for will have post op visit scheduled after surgery.   Feliz Beam, M.D. [facility]

## 2018-06-04 ENCOUNTER — Encounter (HOSPITAL_COMMUNITY): Payer: Self-pay

## 2018-06-04 ENCOUNTER — Encounter: Payer: Self-pay | Admitting: Obstetrics and Gynecology

## 2018-06-18 NOTE — Patient Instructions (Addendum)
Your procedure is scheduled on: Tuesday, Nov 12  Enter through the Main Entrance of [facility] at: 11:30 am  Pick up the phone at the desk and dial [phone].  Call this number if you have problems the morning of surgery: [phone].  Remember: Do NOT eat food after midnight Monday.  Do NOT drink clear liquids (including water) after 7 am Tuesday, day of surgery  Take these medicines the morning of surgery with a SIP OF WATER: amlodipine, metoprolol, omeprazole, pravastatin.  Do not take your Monday night dose of metformin or Tuesday morning day of surgery dose. Do not take any diabetes medication on day of surgery.  We will check your blood sugar upon arrival and treat if needed.    Brush your teeth on the day of surgery.  Bring Albuterol inhaler with you on day of surgery.  Stop herbal medications, vitamin supplements, Ibuprofen/NSAIDS at this time.  Do NOT wear jewelry (body piercing), metal hair clips/bobby pins, make-up, or nail polish. Do NOT wear lotions, powders, or perfumes.  You may wear deoderant. Do NOT shave for 48 hours prior to surgery. Do NOT bring valuables to the hospital.  Leave suitcase in car.  After surgery it may be brought to your room.  For patients admitted to the hospital, checkout time is 11:00 AM the day of discharge. Have a responsible adult drive you home and stay with you for 24 hours after your procedure.  Home with Daughter Rajinique cell [phone] or Myrlene Broker cell [phone].

## 2018-06-25 ENCOUNTER — Other Ambulatory Visit (HOSPITAL_COMMUNITY): Payer: Self-pay | Admitting: Family Medicine

## 2018-06-25 DIAGNOSIS — I10 Essential (primary) hypertension: Secondary | ICD-10-CM

## 2018-06-27 ENCOUNTER — Encounter (HOSPITAL_COMMUNITY): Payer: Self-pay

## 2018-06-27 ENCOUNTER — Other Ambulatory Visit: Payer: Self-pay

## 2018-06-27 ENCOUNTER — Encounter (HOSPITAL_COMMUNITY)
Admission: RE | Admit: 2018-06-27 | Discharge: 2018-06-27 | Disposition: A | Discharge status: Home / Self Care | Payer: 59 | Source: Ambulatory Visit | Attending: Obstetrics and Gynecology | Admitting: Obstetrics and Gynecology

## 2018-06-27 DIAGNOSIS — Z01812 Encounter for preprocedural laboratory examination: Secondary | ICD-10-CM | POA: Diagnosis not present

## 2018-06-27 HISTORY — DX: Personal history of other medical treatment: Z92.89

## 2018-06-27 LAB — BASIC METABOLIC PANEL
Anion gap: 9 (ref 5–15)
BUN: 18 mg/dL (ref 6–20)
CALCIUM: 9.3 mg/dL (ref 8.9–10.3)
CHLORIDE: 102 mmol/L (ref 98–111)
CO2: 27 mmol/L (ref 22–32)
CREATININE: 1.47 mg/dL — AB (ref 0.44–1.00)
GFR, EST AFRICAN AMERICAN: 48 mL/min — AB (ref >60)
GFR, EST NON AFRICAN AMERICAN: 41 mL/min — AB (ref >60)
Glucose, Bld: 160 mg/dL — ABNORMAL HIGH (ref 70–99)
Potassium: 2.7 mmol/L — CL (ref 3.5–5.1)
SODIUM: 138 mmol/L (ref 135–145)

## 2018-06-27 LAB — CBC
HEMATOCRIT: 30.8 % — AB (ref 36.0–46.0)
Hemoglobin: 10.2 g/dL — ABNORMAL LOW (ref 12.0–15.0)
MCH: 28.8 pg (ref 26.0–34.0)
MCHC: 33.1 g/dL (ref 30.0–36.0)
MCV: 87 fL (ref 80.0–100.0)
PLATELETS: 240 10*3/uL (ref 150–400)
RBC: 3.54 MIL/uL — ABNORMAL LOW (ref 3.87–5.11)
RDW: 13 % (ref 11.5–15.5)
WBC: 5 10*3/uL (ref 4.0–10.5)
nRBC: 0 % (ref 0.0–0.2)

## 2018-06-27 LAB — TYPE AND SCREEN
ABO/RH(D): AB POS
ANTIBODY SCREEN: NEGATIVE

## 2018-06-27 LAB — ABO/RH: ABO/RH(D): AB POS

## 2018-06-27 NOTE — Pre-Procedure Instructions (Signed)
SDS BB history log given to lab for patient's previous history of a blood transfusion at [facility] in 2016 and 2018.

## 2018-06-27 NOTE — Pre-Procedure Instructions (Signed)
Renee Alexander from lab called with a critical potassium value 2.7 (low).  Patient is on a potassium chloride 20 mEq/day. Called Anesthesia, per Dr. Royce Macadamia, advise patient to take potassium chloride 20 mEq BID and take Magnesium oxide 500 mg BID.  Verified verbal order.  Patient informed and verbalized understanding.  Called Renee Alexander in [facility] to let Dr Rosana Hoes know critical lab value.  LMOM with the above information.

## 2018-06-30 ENCOUNTER — Encounter (HOSPITAL_COMMUNITY): Payer: Self-pay | Admitting: Anesthesiology

## 2018-06-30 ENCOUNTER — Encounter (HOSPITAL_COMMUNITY): Payer: Self-pay

## 2018-06-30 ENCOUNTER — Telehealth: Payer: Self-pay | Admitting: Obstetrics and Gynecology

## 2018-06-30 NOTE — Telephone Encounter (Signed)
Left message on VM for patient to return call, calling regarding recommendation by anesthesia to cancel scheduled TAH/BS for hypokalemia. Will try again.   Feliz Beam, M.D. [facility]

## 2018-06-30 NOTE — Anesthesia Preprocedure Evaluation (Deleted)
Anesthesia Evaluation    Reviewed: Allergy & Precautions, Patient's Chart, lab work & pertinent test results  History of Anesthesia Complications Negative for: history of anesthetic complications  Airway        Dental   Pulmonary asthma ,           Cardiovascular hypertension, Pt. on medications and Pt. on home beta blockers      Neuro/Psych negative neurological ROS     GI/Hepatic Neg liver ROS, GERD  Medicated and Controlled,  Endo/Other  diabetes, Type 2, Oral Hypoglycemic AgentsObese (BMI 39.3)  Renal/GU negative Renal ROS     Musculoskeletal negative musculoskeletal ROS (+)   Abdominal   Peds  Hematology  (+) anemia , Hgb 10.2 on 06/27/18   Anesthesia Other Findings Day of surgery medications reviewed with the patient.  Reproductive/Obstetrics                             Anesthesia Physical Anesthesia Plan  ASA:   Anesthesia Plan:    Post-op Pain Management:    Induction:   PONV Risk Score and Plan:   Airway Management Planned:   Additional Equipment:   Intra-op Plan:   Post-operative Plan:   Informed Consent:   Plan Discussed with:   Anesthesia Plan Comments: (Case cancelled due to hypokalemia)      Anesthesia Quick Evaluation

## 2018-07-01 ENCOUNTER — Encounter (HOSPITAL_COMMUNITY)
Admission: RE | Disposition: A | Discharge status: Home / Self Care | Payer: Self-pay | Source: Ambulatory Visit | Attending: Obstetrics and Gynecology

## 2018-07-01 ENCOUNTER — Ambulatory Visit (HOSPITAL_COMMUNITY)
Admission: RE | Admit: 2018-07-01 | Discharge: 2018-07-01 | Disposition: A | Discharge status: Home / Self Care | Payer: 59 | Source: Ambulatory Visit | Attending: Obstetrics and Gynecology | Admitting: Obstetrics and Gynecology

## 2018-07-01 DIAGNOSIS — Z5309 Procedure and treatment not carried out because of other contraindication: Secondary | ICD-10-CM | POA: Diagnosis present

## 2018-07-01 DIAGNOSIS — E876 Hypokalemia: Secondary | ICD-10-CM | POA: Insufficient documentation

## 2018-07-01 LAB — BASIC METABOLIC PANEL
Anion gap: 11 (ref 5–15)
BUN: 18 mg/dL (ref 6–20)
CALCIUM: 9.7 mg/dL (ref 8.9–10.3)
CO2: 26 mmol/L (ref 22–32)
Chloride: 101 mmol/L (ref 98–111)
Creatinine, Ser: 1.41 mg/dL — ABNORMAL HIGH (ref 0.44–1.00)
GFR calc Af Amer: 50 mL/min — ABNORMAL LOW (ref >60)
GFR, EST NON AFRICAN AMERICAN: 44 mL/min — AB (ref >60)
GLUCOSE: 116 mg/dL — AB (ref 70–99)
Potassium: 3.1 mmol/L — ABNORMAL LOW (ref 3.5–5.1)
Sodium: 138 mmol/L (ref 135–145)

## 2018-07-01 SURGERY — HYSTERECTOMY, TOTAL, ABDOMINAL, WITH SALPINGECTOMY
Anesthesia: Choice | Laterality: Bilateral

## 2018-07-01 SURGERY — HYSTERECTOMY, ABDOMINAL
Anesthesia: Choice

## 2018-07-01 MED ORDER — CEFAZOLIN SODIUM-DEXTROSE 2-4 GM/100ML-% IV SOLN: 2.0000 g | INTRAVENOUS | Status: DC

## 2018-07-01 NOTE — Progress Notes (Signed)
Patient's surgery was cancelled due to low potassium (3.1).  Dr. Trixie Deis, Anesthesia  and Dr Rosana Hoes talked with patient.  Surgery to be rescheduled in 07/2018.

## 2018-07-02 ENCOUNTER — Ambulatory Visit (HOSPITAL_COMMUNITY): Payer: 59

## 2018-07-14 NOTE — Patient Instructions (Addendum)
Your procedure is scheduled on:  Tuesday, 12/10  Enter through the Main Entrance of [facility] at: 11:30 am  Pick up the phone at the desk and dial [phone].  Call this number if you have problems the morning of surgery: [phone].  Remember: Do NOT eat food after midnight Monday  Do NOT drink clear liquids (including water) after: 7 am Tuesday, day of surgery  Take these medicines the morning of surgery with a SIP OF WATER: amlodipine, metoprolol, omeprazole and pravastatin.  Do not take Metformin on Monday night or Tuesday morning dose.  Do not take any diabetes medication on Tuesday, day of surgery.  We will check your blood sugar upon arrival and treat if needed.  Brush your teeth on the day of surgery.  Bring inhaler with you on day of surgery.  Do Not smoke on the day of surgery.  Stop herbal medications, vitamin supplements, Ibuprofen/NSAIDS at this time (or 1 week prior to surgery).  Do NOT wear jewelry (body piercing), metal hair clips/bobby pins, make-up, or nail polish. Do NOT wear lotions, powders, or perfumes.  You may wear deoderant. Do NOT shave for 48 hours prior to surgery. Do NOT bring valuables to the hospital. Contacts, dentures, or bridgework may not be worn into surgery.  Leave suitcase in car.  After surgery it may be brought to your room.  For patients admitted to the hospital, checkout time is 11:00 AM the day of discharge. Have a responsible adult drive you home and stay with you for 24 hours after your procedure.

## 2018-07-25 ENCOUNTER — Encounter (HOSPITAL_COMMUNITY)
Admission: RE | Admit: 2018-07-25 | Discharge: 2018-07-25 | Disposition: A | Discharge status: Home / Self Care | Payer: 59 | Source: Ambulatory Visit | Attending: Obstetrics and Gynecology | Admitting: Obstetrics and Gynecology

## 2018-07-28 ENCOUNTER — Other Ambulatory Visit: Payer: Self-pay

## 2018-07-28 ENCOUNTER — Encounter (HOSPITAL_COMMUNITY): Payer: Self-pay

## 2018-07-28 ENCOUNTER — Encounter (HOSPITAL_COMMUNITY)
Admission: RE | Admit: 2018-07-28 | Discharge: 2018-07-28 | Disposition: A | Discharge status: Home / Self Care | Payer: 59 | Source: Ambulatory Visit | Attending: Obstetrics and Gynecology | Admitting: Obstetrics and Gynecology

## 2018-07-28 DIAGNOSIS — Z01812 Encounter for preprocedural laboratory examination: Secondary | ICD-10-CM | POA: Insufficient documentation

## 2018-07-28 HISTORY — DX: Personal history of other endocrine, nutritional and metabolic disease: Z86.39

## 2018-07-28 LAB — BASIC METABOLIC PANEL
Anion gap: 10 (ref 5–15)
BUN: 22 mg/dL — AB (ref 6–20)
CALCIUM: 9.5 mg/dL (ref 8.9–10.3)
CO2: 25 mmol/L (ref 22–32)
Chloride: 102 mmol/L (ref 98–111)
Creatinine, Ser: 1.59 mg/dL — ABNORMAL HIGH (ref 0.44–1.00)
GFR calc Af Amer: 44 mL/min — ABNORMAL LOW (ref >60)
GFR calc non Af Amer: 38 mL/min — ABNORMAL LOW (ref >60)
Glucose, Bld: 100 mg/dL — ABNORMAL HIGH (ref 70–99)
POTASSIUM: 3.2 mmol/L — AB (ref 3.5–5.1)
Sodium: 137 mmol/L (ref 135–145)

## 2018-07-28 LAB — CBC
HCT: 32 % — ABNORMAL LOW (ref 36.0–46.0)
Hemoglobin: 10.4 g/dL — ABNORMAL LOW (ref 12.0–15.0)
MCH: 27.7 pg (ref 26.0–34.0)
MCHC: 32.5 g/dL (ref 30.0–36.0)
MCV: 85.3 fL (ref 80.0–100.0)
Platelets: 258 10*3/uL (ref 150–400)
RBC: 3.75 MIL/uL — ABNORMAL LOW (ref 3.87–5.11)
RDW: 13.5 % (ref 11.5–15.5)
WBC: 6.1 10*3/uL (ref 4.0–10.5)
nRBC: 0 % (ref 0.0–0.2)

## 2018-07-28 LAB — TYPE AND SCREEN
ABO/RH(D): AB POS
Antibody Screen: NEGATIVE

## 2018-07-28 NOTE — Patient Instructions (Addendum)
Your procedure is scheduled on:  Tuesday, 07/29/18 @ 1:38 pm  Enter through the Main Entrance of [facility] at: 11 am per MD's instruction  Pick up the phone at the desk and dial [phone].  Call this number if you have problems the morning of surgery: [phone].  Remember: Do NOT eat food or Do NOT drink clear liquids (including water) after midnight Tonight - 07/28/18  Take these medicines the morning of surgery with a SIP OF WATER:  Amlodipine, metoprolol, omeprazole, pravastatin  Do not take your Metformin tonight 12/9 or Tuesday 12/10 morning dose.  We will check your blood sugar upon arrival and treat if needed.  Brush your teeth on the day of surgery.  Bring albuterol inhaler with you on day of surgery.  Stop herbal medications, vitamin supplements, Ibuprofen/NSAIDS at this time.  Do NOT wear jewelry (body piercing), metal hair clips/bobby pins, make-up, or nail polish. Do NOT wear lotions, powders, or perfumes.  You may wear deoderant. Do NOT shave for 48 hours prior to surgery. Do NOT bring valuables to the hospital.  Leave suitcase in car.  After surgery it may be brought to your room.  For patients admitted to the hospital, checkout time is 11:00 AM the day of discharge. Have a responsible adult drive you home and stay with you for 24 hours after your procedure.  Home with Daughter Rajinique cell [phone] or Coralyn Mark cell [phone].

## 2018-07-29 ENCOUNTER — Inpatient Hospital Stay (HOSPITAL_COMMUNITY): Payer: 59 | Admitting: Anesthesiology

## 2018-07-29 ENCOUNTER — Encounter (HOSPITAL_COMMUNITY): Payer: Self-pay | Admitting: Emergency Medicine

## 2018-07-29 ENCOUNTER — Other Ambulatory Visit: Payer: Self-pay

## 2018-07-29 ENCOUNTER — Encounter (HOSPITAL_COMMUNITY)
Admission: RE | Disposition: A | Discharge status: Home / Self Care | Payer: Self-pay | Source: Home / Self Care | Attending: Obstetrics and Gynecology

## 2018-07-29 ENCOUNTER — Inpatient Hospital Stay (HOSPITAL_COMMUNITY)
Admission: RE | Admit: 2018-07-29 | Discharge: 2018-07-31 | DRG: 743 | Disposition: A | Discharge status: Home / Self Care | Payer: 59 | Attending: Obstetrics and Gynecology | Admitting: Obstetrics and Gynecology

## 2018-07-29 DIAGNOSIS — D509 Iron deficiency anemia, unspecified: Secondary | ICD-10-CM | POA: Diagnosis present

## 2018-07-29 DIAGNOSIS — D259 Leiomyoma of uterus, unspecified: Secondary | ICD-10-CM

## 2018-07-29 DIAGNOSIS — D649 Anemia, unspecified: Secondary | ICD-10-CM | POA: Diagnosis present

## 2018-07-29 DIAGNOSIS — I10 Essential (primary) hypertension: Secondary | ICD-10-CM | POA: Diagnosis present

## 2018-07-29 DIAGNOSIS — K219 Gastro-esophageal reflux disease without esophagitis: Secondary | ICD-10-CM | POA: Diagnosis present

## 2018-07-29 DIAGNOSIS — E876 Hypokalemia: Secondary | ICD-10-CM | POA: Diagnosis present

## 2018-07-29 DIAGNOSIS — N926 Irregular menstruation, unspecified: Secondary | ICD-10-CM | POA: Diagnosis present

## 2018-07-29 DIAGNOSIS — N939 Abnormal uterine and vaginal bleeding, unspecified: Secondary | ICD-10-CM

## 2018-07-29 DIAGNOSIS — D242 Benign neoplasm of left breast: Secondary | ICD-10-CM | POA: Diagnosis present

## 2018-07-29 DIAGNOSIS — E119 Type 2 diabetes mellitus without complications: Secondary | ICD-10-CM | POA: Diagnosis present

## 2018-07-29 DIAGNOSIS — Z880 Allergy status to penicillin: Secondary | ICD-10-CM

## 2018-07-29 DIAGNOSIS — N946 Dysmenorrhea, unspecified: Secondary | ICD-10-CM | POA: Diagnosis present

## 2018-07-29 DIAGNOSIS — J45909 Unspecified asthma, uncomplicated: Secondary | ICD-10-CM | POA: Diagnosis present

## 2018-07-29 HISTORY — PX: HYSTERECTOMY ABDOMINAL WITH SALPINGECTOMY: SHX6725

## 2018-07-29 LAB — PREGNANCY, URINE: Preg Test, Ur: NEGATIVE

## 2018-07-29 LAB — GLUCOSE, CAPILLARY: GLUCOSE-CAPILLARY: 178 mg/dL — AB (ref 70–99)

## 2018-07-29 SURGERY — HYSTERECTOMY, TOTAL, ABDOMINAL, WITH SALPINGECTOMY
Anesthesia: General | Site: Abdomen | Laterality: Bilateral

## 2018-07-29 MED ORDER — ACETAMINOPHEN 10 MG/ML IV SOLN
INTRAVENOUS | Status: DC | PRN
  Administered 2018-07-29: 1000 mg via INTRAVENOUS

## 2018-07-29 MED ORDER — FENTANYL CITRATE (PF) 100 MCG/2ML IJ SOLN
INTRAMUSCULAR | Status: DC | PRN
  Administered 2018-07-29: 25 ug via INTRAVENOUS
  Administered 2018-07-29: 50 ug via INTRAVENOUS
  Administered 2018-07-29 (×2): 25 ug via INTRAVENOUS
  Administered 2018-07-29 (×2): 50 ug via INTRAVENOUS
  Administered 2018-07-29: 25 ug via INTRAVENOUS
  Administered 2018-07-29: 50 ug via INTRAVENOUS

## 2018-07-29 MED ORDER — ROCURONIUM BROMIDE 100 MG/10ML IV SOLN
INTRAVENOUS | Status: AC
  Filled 2018-07-29: qty 1

## 2018-07-29 MED ORDER — OXYCODONE HCL 5 MG PO TABS
5.0000 mg | ORAL_TABLET | ORAL | Status: DC | PRN
  Administered 2018-07-30: 10 mg via ORAL
  Administered 2018-07-30: 5 mg via ORAL
  Administered 2018-07-30 – 2018-07-31 (×2): 10 mg via ORAL
  Administered 2018-07-31: 5 mg via ORAL
  Filled 2018-07-29 (×2): qty 2
  Filled 2018-07-29 (×2): qty 1
  Filled 2018-07-29: qty 2

## 2018-07-29 MED ORDER — SUGAMMADEX SODIUM 200 MG/2ML IV SOLN
INTRAVENOUS | Status: DC | PRN
  Administered 2018-07-29: 200 mg via INTRAVENOUS

## 2018-07-29 MED ORDER — HYDROMORPHONE HCL 1 MG/ML IJ SOLN
INTRAMUSCULAR | Status: AC
  Filled 2018-07-29: qty 1

## 2018-07-29 MED ORDER — AMLODIPINE BESYLATE 10 MG PO TABS
10.0000 mg | ORAL_TABLET | Freq: Every day | ORAL | Status: DC
  Administered 2018-07-30 – 2018-07-31 (×2): 10 mg via ORAL
  Filled 2018-07-29 (×2): qty 1

## 2018-07-29 MED ORDER — SCOPOLAMINE 1 MG/3DAYS TD PT72
MEDICATED_PATCH | TRANSDERMAL | Status: AC
  Administered 2018-07-29: 1.5 mg via TRANSDERMAL
  Filled 2018-07-29: qty 1

## 2018-07-29 MED ORDER — DEXMEDETOMIDINE HCL 200 MCG/2ML IV SOLN
INTRAVENOUS | Status: DC | PRN
  Administered 2018-07-29: 4 ug via INTRAVENOUS

## 2018-07-29 MED ORDER — MEPERIDINE HCL 25 MG/ML IJ SOLN: 6.2500 mg | INTRAMUSCULAR | Status: DC | PRN

## 2018-07-29 MED ORDER — OXYCODONE HCL 5 MG/5ML PO SOLN: 5.0000 mg | Freq: Once | ORAL | Status: DC | PRN

## 2018-07-29 MED ORDER — ACETAMINOPHEN 10 MG/ML IV SOLN
INTRAVENOUS | Status: AC
  Filled 2018-07-29: qty 100

## 2018-07-29 MED ORDER — ONDANSETRON HCL 4 MG/2ML IJ SOLN
INTRAMUSCULAR | Status: AC
  Filled 2018-07-29: qty 2

## 2018-07-29 MED ORDER — ONDANSETRON HCL 4 MG PO TABS: 4.0000 mg | ORAL_TABLET | Freq: Four times a day (QID) | ORAL | Status: DC | PRN

## 2018-07-29 MED ORDER — BISACODYL 10 MG RE SUPP
10.0000 mg | Freq: Every day | RECTAL | Status: DC | PRN
  Administered 2018-07-30: 10 mg via RECTAL
  Filled 2018-07-29 (×2): qty 1

## 2018-07-29 MED ORDER — FENTANYL CITRATE (PF) 100 MCG/2ML IJ SOLN
25.0000 ug | INTRAMUSCULAR | Status: DC | PRN
  Administered 2018-07-29 (×2): 50 ug via INTRAVENOUS

## 2018-07-29 MED ORDER — PRAVASTATIN SODIUM 20 MG PO TABS
20.0000 mg | ORAL_TABLET | Freq: Every day | ORAL | Status: DC
  Administered 2018-07-30: 20 mg via ORAL
  Filled 2018-07-29 (×2): qty 1

## 2018-07-29 MED ORDER — FENTANYL CITRATE (PF) 100 MCG/2ML IJ SOLN
INTRAMUSCULAR | Status: AC
  Administered 2018-07-29: 50 ug via INTRAVENOUS
  Filled 2018-07-29: qty 2

## 2018-07-29 MED ORDER — ALBUTEROL SULFATE (2.5 MG/3ML) 0.083% IN NEBU: 3.0000 mL | INHALATION_SOLUTION | Freq: Four times a day (QID) | RESPIRATORY_TRACT | Status: DC | PRN

## 2018-07-29 MED ORDER — ONDANSETRON HCL 4 MG/2ML IJ SOLN
4.0000 mg | Freq: Once | INTRAMUSCULAR | Status: AC | PRN
  Administered 2018-07-29: 4 mg via INTRAVENOUS

## 2018-07-29 MED ORDER — ONDANSETRON HCL 4 MG/2ML IJ SOLN: 4.0000 mg | Freq: Four times a day (QID) | INTRAMUSCULAR | Status: DC | PRN

## 2018-07-29 MED ORDER — HYDROMORPHONE HCL 1 MG/ML IJ SOLN
INTRAMUSCULAR | Status: DC | PRN
  Administered 2018-07-29: 1 mg via INTRAVENOUS

## 2018-07-29 MED ORDER — GENTAMICIN SULFATE 40 MG/ML IJ SOLN
5.0000 mg/kg | Freq: Once | INTRAVENOUS | Status: AC
  Administered 2018-07-29: 371.2 mg via INTRAVENOUS
  Filled 2018-07-29: qty 9.25

## 2018-07-29 MED ORDER — METHYLENE BLUE 0.5 % INJ SOLN
INTRAVENOUS | Status: DC | PRN
  Administered 2018-07-29 (×2): 4 mL via INTRAVENOUS

## 2018-07-29 MED ORDER — SIMETHICONE 80 MG PO CHEW: 80.0000 mg | CHEWABLE_TABLET | Freq: Four times a day (QID) | ORAL | Status: DC | PRN

## 2018-07-29 MED ORDER — ONDANSETRON HCL 4 MG/2ML IJ SOLN: 4.0000 mg | Freq: Once | INTRAMUSCULAR | Status: DC | PRN

## 2018-07-29 MED ORDER — PROPOFOL 10 MG/ML IV BOLUS
INTRAVENOUS | Status: DC | PRN
  Administered 2018-07-29: 50 mg via INTRAVENOUS
  Administered 2018-07-29: 200 mg via INTRAVENOUS
  Administered 2018-07-29: 20 mg via INTRAVENOUS

## 2018-07-29 MED ORDER — LIDOCAINE HCL (CARDIAC) PF 100 MG/5ML IV SOSY
PREFILLED_SYRINGE | INTRAVENOUS | Status: AC
  Filled 2018-07-29: qty 5

## 2018-07-29 MED ORDER — HYDROMORPHONE HCL 1 MG/ML IJ SOLN
0.5000 mg | INTRAMUSCULAR | Status: DC | PRN
  Administered 2018-07-29 – 2018-07-30 (×3): 0.5 mg via INTRAVENOUS
  Filled 2018-07-29 (×3): qty 0.5

## 2018-07-29 MED ORDER — METFORMIN HCL 500 MG PO TABS
500.0000 mg | ORAL_TABLET | Freq: Two times a day (BID) | ORAL | Status: DC
  Administered 2018-07-29 – 2018-07-31 (×4): 500 mg via ORAL
  Filled 2018-07-29 (×6): qty 1

## 2018-07-29 MED ORDER — DEXAMETHASONE SODIUM PHOSPHATE 10 MG/ML IJ SOLN
INTRAMUSCULAR | Status: DC | PRN
  Administered 2018-07-29: 4 mg via INTRAVENOUS

## 2018-07-29 MED ORDER — PROPOFOL 10 MG/ML IV BOLUS
INTRAVENOUS | Status: AC
  Filled 2018-07-29: qty 40

## 2018-07-29 MED ORDER — POTASSIUM CHLORIDE CRYS ER 20 MEQ PO TBCR
20.0000 meq | EXTENDED_RELEASE_TABLET | Freq: Every day | ORAL | Status: DC
  Administered 2018-07-29 – 2018-07-31 (×3): 20 meq via ORAL
  Filled 2018-07-29 (×4): qty 1

## 2018-07-29 MED ORDER — DEXAMETHASONE SODIUM PHOSPHATE 10 MG/ML IJ SOLN
INTRAMUSCULAR | Status: AC
  Filled 2018-07-29: qty 1

## 2018-07-29 MED ORDER — LOSARTAN POTASSIUM 50 MG PO TABS
100.0000 mg | ORAL_TABLET | Freq: Every day | ORAL | Status: DC
  Administered 2018-07-30 – 2018-07-31 (×2): 100 mg via ORAL
  Filled 2018-07-29 (×3): qty 2

## 2018-07-29 MED ORDER — ROCURONIUM BROMIDE 100 MG/10ML IV SOLN
INTRAVENOUS | Status: DC | PRN
  Administered 2018-07-29: 5 mg via INTRAVENOUS
  Administered 2018-07-29: 50 mg via INTRAVENOUS

## 2018-07-29 MED ORDER — FENTANYL CITRATE (PF) 100 MCG/2ML IJ SOLN: 25.0000 ug | INTRAMUSCULAR | Status: DC | PRN

## 2018-07-29 MED ORDER — KETOROLAC TROMETHAMINE 30 MG/ML IJ SOLN
INTRAMUSCULAR | Status: AC
  Filled 2018-07-29: qty 1

## 2018-07-29 MED ORDER — ACETAMINOPHEN 325 MG PO TABS
650.0000 mg | ORAL_TABLET | ORAL | Status: DC | PRN
  Filled 2018-07-29: qty 2

## 2018-07-29 MED ORDER — ACETAMINOPHEN 325 MG PO TABS: 325.0000 mg | ORAL_TABLET | ORAL | Status: DC | PRN

## 2018-07-29 MED ORDER — MIDAZOLAM HCL 2 MG/2ML IJ SOLN
INTRAMUSCULAR | Status: AC
  Filled 2018-07-29: qty 2

## 2018-07-29 MED ORDER — ACETAMINOPHEN 160 MG/5ML PO SOLN: 325.0000 mg | ORAL | Status: DC | PRN

## 2018-07-29 MED ORDER — SCOPOLAMINE 1 MG/3DAYS TD PT72
1.0000 | MEDICATED_PATCH | Freq: Once | TRANSDERMAL | Status: DC
  Administered 2018-07-29: 1.5 mg via TRANSDERMAL

## 2018-07-29 MED ORDER — LIDOCAINE HCL (CARDIAC) PF 100 MG/5ML IV SOSY
PREFILLED_SYRINGE | INTRAVENOUS | Status: DC | PRN
  Administered 2018-07-29: 100 mg via INTRAVENOUS

## 2018-07-29 MED ORDER — DOCUSATE SODIUM 100 MG PO CAPS
100.0000 mg | ORAL_CAPSULE | Freq: Two times a day (BID) | ORAL | Status: DC
  Administered 2018-07-29 – 2018-07-31 (×4): 100 mg via ORAL
  Filled 2018-07-29 (×4): qty 1

## 2018-07-29 MED ORDER — METOPROLOL SUCCINATE ER 100 MG PO TB24
100.0000 mg | ORAL_TABLET | Freq: Every day | ORAL | Status: DC
  Administered 2018-07-30 – 2018-07-31 (×2): 100 mg via ORAL
  Filled 2018-07-29 (×3): qty 1

## 2018-07-29 MED ORDER — TRIAMTERENE-HCTZ 37.5-25 MG PO TABS
1.0000 | ORAL_TABLET | Freq: Every day | ORAL | Status: DC
  Administered 2018-07-30 – 2018-07-31 (×2): 1 via ORAL
  Filled 2018-07-29 (×3): qty 1

## 2018-07-29 MED ORDER — PANTOPRAZOLE SODIUM 40 MG PO TBEC
40.0000 mg | DELAYED_RELEASE_TABLET | Freq: Every day | ORAL | Status: DC
  Administered 2018-07-30 – 2018-07-31 (×2): 40 mg via ORAL
  Filled 2018-07-29 (×2): qty 1

## 2018-07-29 MED ORDER — LACTATED RINGERS IV SOLN
INTRAVENOUS | Status: DC
  Administered 2018-07-29 (×3): via INTRAVENOUS

## 2018-07-29 MED ORDER — OXYCODONE HCL 5 MG PO TABS: 5.0000 mg | ORAL_TABLET | Freq: Once | ORAL | Status: DC | PRN

## 2018-07-29 MED ORDER — MIDAZOLAM HCL 2 MG/2ML IJ SOLN
INTRAMUSCULAR | Status: DC | PRN
  Administered 2018-07-29 (×2): 1 mg via INTRAVENOUS

## 2018-07-29 MED ORDER — FENTANYL CITRATE (PF) 100 MCG/2ML IJ SOLN
INTRAMUSCULAR | Status: AC
  Filled 2018-07-29: qty 2

## 2018-07-29 MED ORDER — CLINDAMYCIN PHOSPHATE 900 MG/50ML IV SOLN: 900.0000 mg | Freq: Once | INTRAVENOUS | Status: DC

## 2018-07-29 MED ORDER — FENTANYL CITRATE (PF) 250 MCG/5ML IJ SOLN
INTRAMUSCULAR | Status: AC
  Filled 2018-07-29: qty 5

## 2018-07-29 MED ORDER — GLYCOPYRROLATE 0.2 MG/ML IJ SOLN
INTRAMUSCULAR | Status: DC | PRN
  Administered 2018-07-29 (×2): 0.1 mg via INTRAVENOUS

## 2018-07-29 MED ORDER — LACTATED RINGERS IV SOLN
INTRAVENOUS | Status: DC
  Administered 2018-07-29 (×2): via INTRAVENOUS

## 2018-07-29 MED ORDER — ENOXAPARIN SODIUM 60 MG/0.6ML ~~LOC~~ SOLN
50.0000 mg | SUBCUTANEOUS | Status: DC
  Administered 2018-07-30 – 2018-07-31 (×2): 50 mg via SUBCUTANEOUS
  Filled 2018-07-29 (×3): qty 0.6

## 2018-07-29 MED ORDER — METHYLENE BLUE 0.5 % INJ SOLN
INTRAVENOUS | Status: AC
  Filled 2018-07-29: qty 10

## 2018-07-29 MED ORDER — SUGAMMADEX SODIUM 200 MG/2ML IV SOLN
INTRAVENOUS | Status: AC
  Filled 2018-07-29: qty 2

## 2018-07-29 SURGICAL SUPPLY — 33 items
CANISTER SUCT 3000ML PPV (MISCELLANEOUS) ×3 IMPLANT
CLEANER TIP ELECTROSURG 2X2 (MISCELLANEOUS) ×3 IMPLANT
CONT PATH 16OZ SNAP LID 3702 (MISCELLANEOUS) ×3 IMPLANT
DRSG OPSITE POSTOP 4X10 (GAUZE/BANDAGES/DRESSINGS) ×3 IMPLANT
DURAPREP 26ML APPLICATOR (WOUND CARE) ×3 IMPLANT
GAUZE 4X4 16PLY RFD (DISPOSABLE) ×3 IMPLANT
GLOVE BIOGEL PI IND STRL 6.5 (GLOVE) ×1 IMPLANT
GLOVE BIOGEL PI IND STRL 7.0 (GLOVE) ×2 IMPLANT
GLOVE BIOGEL PI INDICATOR 6.5 (GLOVE) ×2
GLOVE BIOGEL PI INDICATOR 7.0 (GLOVE) ×4
GLOVE ORTHOPEDIC STR SZ6.5 (GLOVE) ×3 IMPLANT
GOWN STRL REUS W/TWL LRG LVL3 (GOWN DISPOSABLE) ×9 IMPLANT
HEMOSTAT ARISTA ABSORB 3G PWDR (MISCELLANEOUS) IMPLANT
NS IRRIG 1000ML POUR BTL (IV SOLUTION) ×3 IMPLANT
PACK ABDOMINAL GYN (CUSTOM PROCEDURE TRAY) ×3 IMPLANT
PAD OB MATERNITY 4.3X12.25 (PERSONAL CARE ITEMS) ×3 IMPLANT
PENCIL SMOKE EVAC W/HOLSTER (ELECTROSURGICAL) ×3 IMPLANT
PROTECTOR NERVE ULNAR (MISCELLANEOUS) ×6 IMPLANT
SPONGE LAP 18X18 RF (DISPOSABLE) IMPLANT
STAPLER VISISTAT 35W (STAPLE) ×3 IMPLANT
SUT MON AB 4-0 PS1 27 (SUTURE) ×3 IMPLANT
SUT PLAIN 2 0 (SUTURE)
SUT PLAIN 2 0 XLH (SUTURE) ×3 IMPLANT
SUT PLAIN ABS 2-0 CT1 27XMFL (SUTURE) IMPLANT
SUT VIC AB 0 CT1 18XCR BRD8 (SUTURE) ×2 IMPLANT
SUT VIC AB 0 CT1 27 (SUTURE) ×4
SUT VIC AB 0 CT1 27XBRD ANBCTR (SUTURE) ×2 IMPLANT
SUT VIC AB 0 CT1 36 (SUTURE) ×3 IMPLANT
SUT VIC AB 0 CT1 8-18 (SUTURE) ×4
SUT VICRYL 0 27 CT2 27 ABS (SUTURE) ×3 IMPLANT
SUT VICRYL 0 TIES 12 18 (SUTURE) ×3 IMPLANT
TOWEL OR 17X24 6PK STRL BLUE (TOWEL DISPOSABLE) ×6 IMPLANT
TRAY FOLEY W/BAG SLVR 14FR (SET/KITS/TRAYS/PACK) ×3 IMPLANT

## 2018-07-29 NOTE — Anesthesia Preprocedure Evaluation (Addendum)
Anesthesia Evaluation  Patient identified by MRN, date of birth, ID band Patient awake    Reviewed: Allergy & Precautions, H&P , NPO status , Patient's Chart, lab work & pertinent test results, reviewed documented beta blocker date and time   History of Anesthesia Complications Negative for: history of anesthetic complications  Airway Mallampati: II  TM Distance: >3 FB Neck ROM: full    Dental no notable dental hx.    Pulmonary neg pulmonary ROS, asthma ,    Pulmonary exam normal breath sounds clear to auscultation       Cardiovascular Exercise Tolerance: Good hypertension, Pt. on medications and Pt. on home beta blockers negative cardio ROS   Rhythm:regular Rate:Normal     Neuro/Psych negative neurological ROS  negative psych ROS   GI/Hepatic negative GI ROS, Neg liver ROS, GERD  Medicated and Controlled,  Endo/Other  negative endocrine ROSdiabetes, Type 2, Oral Hypoglycemic AgentsObese (BMI 39.3)  Renal/GU negative Renal ROS  negative genitourinary   Musculoskeletal negative musculoskeletal ROS (+)   Abdominal   Peds  Hematology negative hematology ROS (+) Blood dyscrasia, anemia , Hgb 10.2 on 06/27/18   Anesthesia Other Findings Day of surgery medications reviewed with the patient.  Reproductive/Obstetrics negative OB ROS                            Anesthesia Physical  Anesthesia Plan  ASA: III  Anesthesia Plan: General   Post-op Pain Management:    Induction:   PONV Risk Score and Plan: 3 and Treatment may vary due to age or medical condition, Ondansetron, Dexamethasone and Midazolam  Airway Management Planned: LMA and Oral ETT  Additional Equipment:   Intra-op Plan:   Post-operative Plan:   Informed Consent: I have reviewed the patients History and Physical, chart, labs and discussed the procedure including the risks, benefits and alternatives for the proposed  anesthesia with the patient or authorized representative who has indicated his/her understanding and acceptance.   Dental Advisory Given  Plan Discussed with: CRNA, Anesthesiologist and Surgeon  Anesthesia Plan Comments: (Case cancelled due to hypokalemia)       Anesthesia Quick Evaluation

## 2018-07-29 NOTE — Anesthesia Procedure Notes (Signed)
Procedure Name: Intubation Date/Time: 07/29/2018 1:46 PM Performed by: Georgeanne Nim, CRNA Pre-anesthesia Checklist: Patient identified, Emergency Drugs available, Suction available, Patient being monitored and Timeout performed Patient Re-evaluated:Patient Re-evaluated prior to induction Oxygen Delivery Method: Circle system utilized Preoxygenation: Pre-oxygenation with 100% oxygen Induction Type: IV induction Ventilation: Mask ventilation without difficulty and Oral airway inserted - appropriate to patient size Laryngoscope Size: Mac and 4 Grade View: Grade I Tube size: 7.0 mm Number of attempts: 1 Airway Equipment and Method: Stylet (blankets under chest for optimal sniffing) Placement Confirmation: ETT inserted through vocal cords under direct vision,  positive ETCO2,  CO2 detector and breath sounds checked- equal and bilateral Secured at: 22 cm Tube secured with: Tape Dental Injury: Teeth and Oropharynx as per pre-operative assessment

## 2018-07-29 NOTE — Op Note (Signed)
[name] PROCEDURE DATE: 07/29/2018  PREOPERATIVE DIAGNOSES: abnormal uterine bleeding, fibroid uterus  POSTOPERATIVE DIAGNOSES: same  PROCEDURE: Total Abdominal Hysterectomy, Bilateral Salpingectomy, Cystoscopy  SURGEON:  K. Arvilla Meres, MD  ASSISTANT:  Clovia Cuff, MD  An experienced assistant was required given the standard of surgical care given the complexity of the case.  This assistant was needed for exposure, dissection, suctioning, retraction, instrument exchange, and for overall help during the procedure.  ANESTHESIOLOGY TEAM: Anesthesiologist: Janeece Riggers, MD  INDICATIONS: Renee Alexander is a 47 y.o. [id number] here for abdominal hysterectomy secondary to the indications listed under preoperative diagnoses; please see preoperative note for further details.  The risks of abdominal hysterectomy were discussed with the patient including but were not limited to: bleeding which may require transfusion or reoperation; infection which may require antibiotics; injury to bowel, bladder, ureters or other surrounding organs; need for additional procedures, incisional problems, thromboembolic phenomenon and other postoperative/anesthesia complications. She verbalizes understanding that removal of her uterus means she will not be able to get pregnant/carry a pregnancy. She consents to blood transfusion in the event of an emergency. The patient verbalized understanding of the plan, giving informed written consent for the procedure.    FINDINGS:  Large fibroid uterus with multiple fibroids, some filmy peritoneal adhesions to uterus, significant scarring of bladder onto lower uterine segement  UPT: negative ANESTHESIA: General INTRAVENOUS FLUIDS: 2000 ml   ESTIMATED BLOOD LOSS: 250 ml URINE OUTPUT:  200 ml SPECIMENS: Specimen sent to pathology COMPLICATIONS: None immediate  PROCEDURE IN DETAIL:  The patient preoperatively received intravenous antibiotics and had sequential compression  devices applied to her lower extremities.  She was then taken to the operating room where she was placed under general anesthesia without difficulty. She was then placed in a dorsal supine position, a foley catheter was placed in the bladder under sterile technique and attached to gravity drainage. She was then prepped and draped in a sterile manner.  After a timeout was performed, a Pfannenstiel skin incision was made with scalpel over her preexisting scar and carried through to the underlying layer of fascia. The fascia was incised in the midline, and this incision was extended bilaterally using the Mayo scissors.  Kocher clamps were applied to the superior aspect of the fascial incision and the underlying rectus muscles were dissected off bluntly and sharply.  A similar process was carried out on the inferior aspect of the fascial incision. The rectus muscles were separated in the midline bluntly and the peritoneum was entered sharply. The peritoneal incision was carefully extended sharply laterally and caudad with good visualization of the bladder. The uterus appeared bulky and was gently exteriorized for improved visualization.  The bowel was packed away with moist laparotomy sponges. The round ligaments on each side were clamped, suture ligated, and transected allowing entry into the broad ligament. The bladder was densely adhered to the lower uterine segment and a bladder flap was then created across the anterior leaf of the broad ligament by dissecting the bladder off the lower uterine segment using a combination of sharp and blunt dissection.   A hole was created in the clear portion of the posterior broad ligament, and the utero-ovarian ligament and vessels were clamped on the patient's left side. This pedicle was then cut and doubly suture ligated with good hemostasis. This procedure was repeated in an identical fashion on the right side allowing for both adnexa to remain in place.  The uterine  arteries were then skeletonized bilaterally and  then clamped, cut, and ligated with care given to prevent ureteral injury. The uterosacral ligaments were then clamped, cut, and ligated bilaterally. The uterus was then amputated across the lower uterine segment leaving the cervix intact. This process was continued down until the cervicovaginal junction was reached.   Acutely curved clamps were placed across the vagina just under the cervix, and the remaining cervix was amputated. The vaginal cuff was closed with a series of interrupted 0 Vicryl figure-of-eight sutures via a modified Richardson technique to incorporate the angles with care given to incorporate the anterior pubocervical fascia and the posterior rectovaginal fascia.  The vaginal cuff angles were noted to have good hemostasis.  The right adnexa was grasped using a Babcock and the left fallopian tube was clamped, cut and ligated from the adnexa using 0 vicryl. Inspection then showed hemostasis. The same procedure was then performed on the right and fallopian tube. Hemostasis noted at the left adnexa.The excised fallopian tubes were added to the specimen which was then sent to pathology.  The pelvis was inspected and hemostasis was confirmed on all surfaces. Arista placed in pelvis for additional hemostasis. The fascia was then closed using 0 PDS in a running fashion.  The subcutaneous layer was irrigated, then reapproximated with 2-0 plain gut interrupted stitches.  The skin was closed with staples.   The patient was then put into dorsal lithotomy position and the foley was removed. She was prepped and the 70 degree cystoscope was inserted into her bladder. The bladder appeared normal. Bilateral ureteral jets were noted and the cystoscope was removed. A foley was then placed under sterile conditions. At this point, the procedure was completed.  The patient tolerated the procedure well. Sponge, lap, instrument and needle counts were correct x 3.   She was taken to the recovery room in stable condition.    Feliz Beam, M.D. Attending [place], [facility] for [employer], [place]

## 2018-07-29 NOTE — Anesthesia Postprocedure Evaluation (Signed)
Anesthesia Post Note  Patient: Renee Alexander  Procedure(s) Performed: HYSTERECTOMY ABDOMINAL WITH SALPINGECTOMY (Bilateral Abdomen)     Patient location during evaluation: PACU Anesthesia Type: General Level of consciousness: awake and alert Pain management: pain level controlled Vital Signs Assessment: post-procedure vital signs reviewed and stable Respiratory status: spontaneous breathing, nonlabored ventilation, respiratory function stable and patient connected to nasal cannula oxygen Cardiovascular status: blood pressure returned to baseline and stable Postop Assessment: no apparent nausea or vomiting Anesthetic complications: no    Last Vitals:  Vitals:   07/29/18 1730 07/29/18 1747  BP: (!) 146/71 (!) 154/79  Pulse: 64 93  Resp: 15 16  Temp: 37.2 C 36.8 C  SpO2: 92% 98%    Last Pain:  Vitals:   07/29/18 1747  TempSrc: Oral  PainSc:    Pain Goal: Patients Stated Pain Goal: 2 (07/29/18 1630)               Asiana Benninger

## 2018-07-29 NOTE — Anesthesia Postprocedure Evaluation (Signed)
Anesthesia Post Note  Patient: Renee Alexander  Procedure(s) Performed: HYSTERECTOMY ABDOMINAL WITH SALPINGECTOMY (Bilateral Abdomen)     Patient location during evaluation: [facility] Unit Anesthesia Type: General Level of consciousness: awake Pain management: pain level controlled Vital Signs Assessment: post-procedure vital signs reviewed and stable Respiratory status: spontaneous breathing Cardiovascular status: stable Postop Assessment: no apparent nausea or vomiting Anesthetic complications: no    Last Vitals:  Vitals:   07/29/18 1747 07/29/18 1817  BP: (!) 154/79   Pulse: 93   Resp: 16   Temp: 36.8 C   SpO2: 98% 99%    Last Pain:  Vitals:   07/29/18 1830  TempSrc:   PainSc: 8    Pain Goal: Patients Stated Pain Goal: 2 (07/29/18 1830)               Everette Rank

## 2018-07-29 NOTE — Transfer of Care (Signed)
Immediate Anesthesia Transfer of Care Note  Patient: Renee Alexander  Procedure(s) Performed: HYSTERECTOMY ABDOMINAL WITH SALPINGECTOMY (Bilateral Abdomen)  Patient Location: PACU  Anesthesia Type:General  Level of Consciousness: drowsy and patient cooperative  Airway & Oxygen Therapy: Patient Spontanous Breathing and Patient connected to nasal cannula oxygen  Post-op Assessment: Report given to RN and Post -op Vital signs reviewed and stable  Post vital signs: Reviewed and stable  Last Vitals:  Vitals Value Taken Time  BP 152/76 07/29/2018  4:12 PM  Temp    Pulse 86 07/29/2018  4:14 PM  Resp 20 07/29/2018  4:14 PM  SpO2 100 % 07/29/2018  4:14 PM  Vitals shown include unvalidated device data.  Last Pain:  Vitals:   07/29/18 1124  TempSrc: Oral  PainSc: 2          Complications: No apparent anesthesia complications

## 2018-07-29 NOTE — H&P (Signed)
OB/GYN History and Physical  Renee Alexander is a 47 y.o. [id number] presenting for surgical management of for AUB and fibroid uterus. She continues to have irregular periods and dysmenorrhea. She is eager for definitive management. Negative EMB and pap.      Past Medical History:  Diagnosis Date  . Anemia   . Asthma    rarely uses inhaler  . Diabetes mellitus without complication ([place])    type 2  . Genital herpes   . GERD (gastroesophageal reflux disease)   . History of blood transfusion    [facility]  . History of low potassium   . Hypertension   . Intraductal papilloma of left breast 04/09/2018  . Papilloma of left breast   . SVD (spontaneous vaginal delivery)    x 4    Past Surgical History:  Procedure Laterality Date  . BREAST LUMPECTOMY WITH RADIOACTIVE SEED LOCALIZATION Left 04/09/2018   Procedure: BREAST LUMPECTOMY WITH RADIOACTIVE SEED LOCALIZATION;  Surgeon: Fanny Skates, MD;  Location: [facility];  Service: General;  Laterality: Left;  . CESAREAN SECTION     x 1-twins  . TUBAL LIGATION     x2  . WISDOM TOOTH EXTRACTION      OB History  Gravida Para Term Preterm AB Living  10 5 4 1 5 6   SAB TAB Ectopic Multiple Live Births    5   1 6     # Outcome Date GA Lbr Len/2nd Weight Sex Delivery Anes PTL Lv  10 Term 01/28/98 [redacted]w[redacted]d    Vag-Spont Pud  LIV  9 Term 11/18/95 [redacted]w[redacted]d    Vag-Spont EPI  LIV  8A Preterm 07/21/93 [redacted]w[redacted]d    CS-LTranv Spinal  LIV  8B Preterm 07/21/93      Spinal  LIV  7 Term 01/29/88     Vag-Spont EPI  LIV  6 Term 11/11/85 [redacted]w[redacted]d   M Vag-Spont EPI  LIV  5 TAB           4 TAB           3 TAB           2 TAB           1 TAB             Social History   Socioeconomic History  . Marital status: Single    Spouse name: Not on file  . Number of children: Not on file  . Years of education: Not on file  . Highest education level: Not on file  Occupational History  . Not on file  Social Needs  . Financial resource strain: Not on file    . Food insecurity:    Worry: Not on file    Inability: Not on file  . Transportation needs:    Medical: Not on file    Non-medical: Not on file  Tobacco Use  . Smoking status: Never Smoker  . Smokeless tobacco: Never Used  Substance and Sexual Activity  . Alcohol use: No  . Drug use: No  . Sexual activity: Yes    Birth control/protection: Surgical  Lifestyle  . Physical activity:    Days per week: Not on file    Minutes per session: Not on file  . Stress: Not on file  Relationships  . Social connections:    Talks on phone: Not on file    Gets together: Not on file    Attends religious service: Not on file    Active member  of club or organization: Not on file    Attends meetings of clubs or organizations: Not on file    Relationship status: Not on file  Other Topics Concern  . Not on file  Social History Narrative  . Not on file    Family History  Problem Relation Age of Onset  . Drug abuse Mother   . Diabetes Mellitus II Father   . Liver disease Father   . Breast cancer Sister     Medications Prior to Admission  Medication Sig Dispense Refill Last Dose  . albuterol (PROVENTIL HFA;VENTOLIN HFA) 108 (90 BASE) MCG/ACT inhaler Inhale 2 puffs into the lungs every 6 (six) hours as needed for wheezing or shortness of breath. (Patient taking differently: Inhale 2 puffs into the lungs every 6 (six) hours as needed for wheezing or shortness of breath. ) 1 Inhaler 2 Past Month at Unknown time  . amLODipine (NORVASC) 10 MG tablet Take 1 tablet (10 mg total) by mouth daily. 30 tablet 0 07/29/2018 at 0900  . Biotin w/ Vitamins C & E (HAIR/SKIN/NAILS PO) Take 1 tablet by mouth daily.   Past Week at Unknown time  . losartan (COZAAR) 100 MG tablet Take 100 mg by mouth daily.   07/28/2018 at Unknown time  . metFORMIN (GLUCOPHAGE) 500 MG tablet Take 500 mg by mouth 2 (two) times daily with a meal.    07/28/2018 at Unknown time  . metoprolol succinate (TOPROL-XL) 100 MG 24 hr tablet Take  100 mg by mouth daily. Take with or immediately following a meal.   07/29/2018 at 0900  . omeprazole (PRILOSEC) 20 MG capsule Take 20 mg by mouth daily.   07/29/2018 at 0900  . Potassium Chloride ER 20 MEQ TBCR Take 20 mEq by mouth daily.    Past Month at Unknown time  . pravastatin (PRAVACHOL) 20 MG tablet Take 20 mg by mouth daily.   07/29/2018 at 0900  . triamterene-hydrochlorothiazide (MAXZIDE-25) 37.5-25 MG tablet Take 1 tablet by mouth daily.   07/28/2018 at Unknown time  . hydrochlorothiazide (HYDRODIURIL) 25 MG tablet Take 25 mg by mouth daily.   0 06/30/2018 at Unknown time  . lisinopril (PRINIVIL,ZESTRIL) 5 MG tablet Take 1 tablet (5 mg total) by mouth daily. (Patient not taking: Reported on 06/26/2018) 30 tablet 11 Not Taking at Unknown time  . polyethylene glycol (MIRALAX / GLYCOLAX) packet Take 17 g by mouth 2 (two) times daily. (Patient not taking: Reported on 06/26/2018) 14 each 0 More than a month at Unknown time    Allergies  Allergen Reactions  . Penicillins Hives and Other (See Comments)    Has patient had a PCN reaction causing immediate rash, facial/tongue/throat swelling, SOB or lightheadedness with hypotension: No Has patient had a PCN reaction causing severe rash involving mucus membranes or skin necrosis: No Has patient had a PCN reaction that required hospitalization No Has patient had a PCN reaction occurring within the last 10 years: }No If all of the above answers are "NO", then may proceed with Cephalosporin     Review of Systems: Negative except for what is mentioned in HPI.     Physical Exam: Pulse 86   Temp 98.5 F (36.9 C) (Oral)   Resp 16   Ht 5\' 4"  (1.626 m)   Wt 103 kg   LMP 07/07/2018 (Exact Date)   SpO2 100%   BMI 38.98 kg/m  CONSTITUTIONAL: Well-developed, well-nourished female in no acute distress.  HENT:  Normocephalic, atraumatic, External right and left  ear normal. Oropharynx is clear and moist EYES: Conjunctivae and EOM are normal. Pupils  are equal, round, and reactive to light. No scleral icterus.  NECK: Normal range of motion, supple, no masses SKIN: Skin is warm and dry. No rash noted. Not diaphoretic. No erythema. No pallor. [place]: Alert and oriented to person, place, and time. Normal reflexes, muscle tone coordination. No cranial nerve deficit noted. PSYCHIATRIC: Normal mood and affect. Normal behavior. Normal judgment and thought content. CARDIOVASCULAR: Normal heart rate noted, regular rhythm RESPIRATORY: Effort and breath sounds normal, no problems with respiration noted ABDOMEN: Soft, nontender, nondistended, gravid. Well-healed Pfannenstiel incision. PELVIC: Deferred MUSCULOSKELETAL: Normal range of motion. No edema and no tenderness. 2+ distal pulses.   Pertinent Labs/Studies:   Results for orders placed or performed during the hospital encounter of 07/29/18 (from the past 72 hour(s))  Pregnancy, urine     Status: None   Collection Time: 07/29/18 11:00 AM  Result Value Ref Range   Preg Test, Ur NEGATIVE NEGATIVE    Comment:        THE SENSITIVITY OF THIS METHODOLOGY IS >20 mIU/mL. Performed at [facility], [address], [place], [place] [address]    CLINICAL DATA:  Heavy menses, menorrhagia, enlarged uterus  EXAM: TRANSABDOMINAL AND TRANSVAGINAL ULTRASOUND OF PELVIS  TECHNIQUE: Both transabdominal and transvaginal ultrasound examinations of the pelvis were performed. Transabdominal technique was performed for global imaging of the pelvis including uterus, ovaries, adnexal regions, and pelvic cul-de-sac. It was necessary to proceed with endovaginal exam following the transabdominal exam to visualize the endometrium and LEFT ovary.  COMPARISON:  None  FINDINGS: Uterus  Measurements: 14.3 x 10.8 x 14.5 cm. Enlarged nodular uterus consistent with multiple leiomyomata. Large leiomyoma at the anterior LEFT lateral aspect of uterus 8.7 x 6.6 x 7.4 cm, likely transmural. Additional  masses are seen at the RIGHT fundus 4.6 x 4.8 x 5.4 cm and posteriorly on RIGHT 5.1 x 5.1 x 5.2 cm partially calcified and extending submucosal.  Endometrium  Thickness: 10 mm thick. Distorted by leiomyomata. No endometrial fluid.  Right ovary  Measurements: 2.6 x 1.6 x 1.6 cm.  Normal morphology without mass  Left ovary  Measurements: 5.4 x 2.9 x 5.0 cm. Complex cystic lesion of the LEFT ovary 4.2 x 4.3 x 3.0 cm containing numerous thin to slightly thicker/irregular septations and a fluid fluid level.  Other findings  Trace free pelvic fluid.  No other adnexal masses.  IMPRESSION: Enlarged uterus containing multiple large leiomyomata.  Complicated cystic lesion of the LEFT ovary containing irregular septations and independent fluid fluid level, 4.3 cm in greatest size; cystic ovarian neoplasm not excluded.  Consider either surgical evaluation or further characterization by MR.   Electronically Signed   By: Lavonia Dana M.D.   On: 02/04/2018 14:00  CBC Latest Ref Rng & Units 07/28/2018 06/27/2018 04/16/2018  WBC 4.0 - 10.5 K/uL 6.1 5.0 5.1  Hemoglobin 12.0 - 15.0 g/dL 10.4(L) 10.2(L) 10.1(L)  Hematocrit 36.0 - 46.0 % 32.0(L) 30.8(L) 30.1(L)  Platelets 150 - 400 K/uL 258 240 211       Assessment and Plan :Renee Alexander is a 47 y.o. [id number] admitted for surgical management of abnormal uterine bleeding and fibroid uterus. Reviewed recommendation for abdominal hysterectomy and that given size of uterus, would be very difficult to do minimally invasive hysterectomy. The risks of abdominal hysterectomy were discussed with the patient; including but not limited to: infection which may require antibiotics; bleeding which may require transfusion or re-operation;  injury to bowel, bladder, ureters or other surrounding organs; need for additional procedures, incisional problems, thromboembolic phenomenon and other postoperative/anesthesia complications. Reviewed that  with a hysterectomy, she will not be able to have children in the future. Reviewed recommendation to remove fallopian tubes to reduce risk of ovarian cancer, but as she is 5, would not recommend removal of ovaries at this time. Reviewed that ovaries may be removed if they are abnormal appearing, anatomy requires it or if there is damage to blood supply. Reviewed that removal of ovaries would put her into surgical menopause and we will attempt to avoid this if possible. Patient verbalized understanding of the above and consents to blood transfusion in the event of a life-threatening hemorrhage. Answered all questions.    Patient is NPO Anesthesia aware Preoperative prophylactic antibiotics ordered SCDs  Admission labs To OR when ready    K. Arvilla Meres, M.D. Attending [place], [facility] for [employer], [place]

## 2018-07-29 NOTE — Addendum Note (Signed)
Addendum  created 07/29/18 1838 by Georgeanne Nim, CRNA   Sign clinical note

## 2018-07-29 NOTE — Brief Op Note (Signed)
07/29/2018  3:51 PM  PATIENT:  Sharmon Leyden  47 y.o. female  PRE-OPERATIVE DIAGNOSIS:  Fibroid Uterus  POST-OPERATIVE DIAGNOSIS:  fibroid uterus  PROCEDURE:  Procedure(s): HYSTERECTOMY ABDOMINAL WITH SALPINGECTOMY (Bilateral)  SURGEON:  Surgeon(s) and Role:    * Sloan Leiter, MD - Primary    * Dove, Wilhemina Cash, MD - Assisting  PHYSICIAN ASSISTANT:   ASSISTANTS: none   ANESTHESIA:   general  EBL:  250 mL   BLOOD ADMINISTERED:none  DRAINS: none   LOCAL MEDICATIONS USED:  NONE  SPECIMEN: uterus, bilateral tubes, cervix  DISPOSITION OF SPECIMEN:  PATHOLOGY  COUNTS:  YES  TOURNIQUET:  * No tourniquets in log *  DICTATION: .Note written in EPIC  PLAN OF CARE: Admit for overnight observation  PATIENT DISPOSITION:  PACU - hemodynamically stable.   Delay start of Pharmacological VTE agent (>24hrs) due to surgical blood loss or risk of bleeding: no

## 2018-07-30 ENCOUNTER — Encounter (HOSPITAL_COMMUNITY): Payer: Self-pay | Admitting: Obstetrics and Gynecology

## 2018-07-30 LAB — CBC
HCT: 26.7 % — ABNORMAL LOW (ref 36.0–46.0)
HEMOGLOBIN: 8.6 g/dL — AB (ref 12.0–15.0)
MCH: 27.4 pg (ref 26.0–34.0)
MCHC: 32.2 g/dL (ref 30.0–36.0)
MCV: 85 fL (ref 80.0–100.0)
Platelets: 230 10*3/uL (ref 150–400)
RBC: 3.14 MIL/uL — ABNORMAL LOW (ref 3.87–5.11)
RDW: 13.8 % (ref 11.5–15.5)
WBC: 7.9 10*3/uL (ref 4.0–10.5)
nRBC: 0 % (ref 0.0–0.2)

## 2018-07-30 LAB — GLUCOSE, CAPILLARY
GLUCOSE-CAPILLARY: 110 mg/dL — AB (ref 70–99)
Glucose-Capillary: 161 mg/dL — ABNORMAL HIGH (ref 70–99)
Glucose-Capillary: 110 mg/dL — ABNORMAL HIGH (ref 70–99)
Glucose-Capillary: 115 mg/dL — ABNORMAL HIGH (ref 70–99)
Glucose-Capillary: 138 mg/dL — ABNORMAL HIGH (ref 70–99)

## 2018-07-30 LAB — BASIC METABOLIC PANEL
Anion gap: 10 (ref 5–15)
BUN: 19 mg/dL (ref 6–20)
CO2: 26 mmol/L (ref 22–32)
Calcium: 8.7 mg/dL — ABNORMAL LOW (ref 8.9–10.3)
Chloride: 102 mmol/L (ref 98–111)
Creatinine, Ser: 1.53 mg/dL — ABNORMAL HIGH (ref 0.44–1.00)
GFR calc non Af Amer: 40 mL/min — ABNORMAL LOW (ref >60)
GFR, EST AFRICAN AMERICAN: 46 mL/min — AB (ref >60)
Glucose, Bld: 133 mg/dL — ABNORMAL HIGH (ref 70–99)
Potassium: 3.3 mmol/L — ABNORMAL LOW (ref 3.5–5.1)
Sodium: 138 mmol/L (ref 135–145)

## 2018-07-30 NOTE — Progress Notes (Signed)
Patients blood pressures elevated. Dr. Nehemiah Settle notified and orders to give morning medications early.

## 2018-07-30 NOTE — Progress Notes (Signed)
    Gynecology Progress Note  Admission Date: 07/29/2018 Current Date: 07/30/2018 9:40 AM  Jearldine EULALIA ELLERMAN is a 47 y.o. [id number] POD#1 admitted for TAH, BS   History complicated by: Patient Active Problem List   Diagnosis Date Noted  . Fibroid uterus 07/29/2018  . Abnormal uterine bleeding 07/29/2018  . Intraductal papilloma of left breast 04/09/2018  . Hypertensive urgency 03/08/2017  . Orthostatic hypotension 03/08/2017  . Anemia 06/17/2015  . Shortness of breath 06/17/2015  . HTN (hypertension) 06/17/2015  . Iron deficiency anemia 05/07/2013  . Weakness generalized 05/06/2013  . Acute blood loss anemia 05/06/2013  . Hypokalemia 05/06/2013  . Chest pain 05/06/2013    Subjective:  Patient feeling better this am, reports pain is much better controlled. She has ambulated without issue this am, denies light-headedness, dizziness, chest pain. She had jello last night, denies nausea/vomiting, has not eaten yet today. Foley recently removed, has not voided yet. Has not passed gas yet.  Objective:   Vitals:   07/30/18 0415 07/30/18 0430 07/30/18 0550 07/30/18 0745  BP: (!) 171/79 (!) 165/78 (!) 145/60 (!) 153/77  Pulse:  68 66 67  Resp:    16  Temp:    99 F (37.2 C)  TempSrc:    Oral  SpO2:    99%  Weight:      Height:        Physical exam: BP (!) 153/77 (BP Location: Right Arm)   Pulse 67   Temp 99 F (37.2 C) (Oral)   Resp 16   Ht 5\' 4"  (1.626 m)   Wt 103 kg   LMP 07/07/2018 (Exact Date)   SpO2 99%   BMI 38.98 kg/m  CONSTITUTIONAL: Well-developed, well-nourished female in no acute distress.  HENT:  Normocephalic, atraumatic, External right and left ear normal. Oropharynx is clear and moist EYES: Conjunctivae and EOM are normal. Pupils are equal, round, and reactive to light. No scleral icterus.  NECK: Normal range of motion, supple, no masses.  Normal thyroid.  SKIN: Skin is warm and dry. No rash noted. Not diaphoretic. No erythema. No pallor. NEUROLOGIC:  Alert and oriented to person, place, and time. Normal reflexes, muscle tone coordination. No cranial nerve deficit noted. PSYCHIATRIC: Normal mood and affect. Normal behavior. Normal judgment and thought content. CARDIOVASCULAR: Normal heart rate noted, regular rhythm RESPIRATORY: Clear to auscultation bilaterally. Effort and breath sounds normal, no problems with respiration noted. ABDOMEN: Soft, hypoactive bowel sounds, no distention noted. incision clean/dry/intact with no evidence of active bleeding,   PELVIC: deferred MUSCULOSKELETAL: Normal range of motion. No tenderness.  No cyanosis, clubbing, or edema.    UOP: 200 mL/hr, foley recently out  Labs  Recent Labs  Lab 07/28/18 1440 07/30/18 0547  WBC 6.1 7.9  HGB 10.4* 8.6*  HCT 32.0* 26.7*  PLT 258 230     Assessment & Plan:   Patient is 47 y.o. [id number] POD#1 s/p TAH, BS. She is doing well, has been on IV pain meds all night, will transition to PO this am. Foley recently out, due to void. BP meds given early this am for elevated pressures. Overall, progressing well. Aim for ambulating and regular diet today. Likely for discharge home tomorrow.  Regular diet Home BP meds Saline lock Activity as tolerated    Feliz Beam, M.D. Attending Center for [employer] farm manager)

## 2018-07-31 LAB — GLUCOSE, CAPILLARY: Glucose-Capillary: 131 mg/dL — ABNORMAL HIGH (ref 70–99)

## 2018-07-31 MED ORDER — OXYCODONE HCL 5 MG PO TABS: 5.0000 mg | ORAL_TABLET | Freq: Four times a day (QID) | ORAL | 0 refills | Status: DC | PRN

## 2018-07-31 NOTE — Discharge Instructions (Signed)
Abdominal Hysterectomy, Care After °This sheet gives you information about how to care for yourself after your procedure. Your doctor may also give you more specific instructions. If you have problems or questions, contact your doctor. °Follow these instructions at home: °Bathing °· Do not take baths, swim, or use a hot tub until your doctor says it is okay. Ask your doctor if you can take showers. You may only be allowed to take sponge baths for bathing. °· Keep the bandage (dressing) dry until your doctor says it can be taken off. °Surgical cut ( °incision) care °· Follow instructions from your doctor about how to take care of your cut from surgery. Make sure you: °? Wash your hands with soap and water before you change your bandage (dressing). If you cannot use soap and water, use hand sanitizer. °? Change your bandage as told by your doctor. °? Leave stitches (sutures), skin glue, or skin tape (adhesive) strips in place. They may need to stay in place for 2 weeks or longer. If tape strips get loose and curl up, you may trim the loose edges. Do not remove tape strips completely unless your doctor says it is okay. °· Check your surgical cut area every day for signs of infection. Check for: °? Redness, swelling, or pain. °? Fluid or blood. °? Warmth. °? Pus or a bad smell. °Activity °· Do gentle, daily exercise as told by your doctor. You may be told to take short walks every day and go farther each time. °· Do not lift anything that is heavier than 10 lb (4.5 kg), or the limit that your doctor tells you, until he or she says that it is safe. °· Do not drive or use heavy machinery while taking prescription pain medicine. °· Do not drive for 24 hours if you were given a medicine to help you relax (sedative). °· Follow your doctor's advice about exercise, driving, and general activities. Ask your doctor what activities are safe for you. °Lifestyle °· Do not douche, use tampons, or have sex for at least 6 weeks or as  told by your doctor. °· Do not drink alcohol until your doctor says it is okay. °· Drink enough fluid to keep your pee (urine) clear or pale yellow. °· Try to have someone at home with you for the first 1-2 weeks to help. °· Do not use any products that contain nicotine or tobacco, such as cigarettes and e-cigarettes. These can slow down healing. If you need help quitting, ask your doctor. °General instructions °· Take over-the-counter and prescription medicines only as told by your doctor. °· Do not take aspirin or ibuprofen. These medicines can cause bleeding. °· To prevent or treat constipation while you are taking prescription pain medicine, your doctor may suggest that you: °? Drink enough fluid to keep your urine clear or pale yellow. °? Take over-the-counter or prescription medicines. °? Eat foods that are high in fiber, such as: °§ Fresh fruits and vegetables. °§ Whole grains. °§ Beans. °? Limit foods that are high in fat and processed sugars, such as fried and sweet foods. °· Keep all follow-up visits as told by your doctor. This is important. °Contact a doctor if: °· You have chills or fever. °· You have redness, swelling, or pain around your cut. °· You have fluid or blood coming from your cut. °· Your cut feels warm to the touch. °· You have pus or a bad smell coming from your cut. °· Your cut breaks   open. °· You feel dizzy or light-headed. °· You have pain or bleeding when you pee. °· You keep having watery poop (diarrhea). °· You keep feeling sick to your stomach (nauseous) or keep throwing up (vomiting). °· You have unusual fluid (discharge) coming from your vagina. °· You have a rash. °· You have a reaction to your medicine. °· Your pain medicine does not help. °Get help right away if: °· You have a fever and your symptoms get worse all of a sudden. °· You have very bad belly (abdominal) pain. °· You are short of breath. °· You pass out (faint). °· You have pain, swelling, or redness of your  leg. °· You bleed a lot from your vagina and notice clumps of blood (clots). °Summary °· Do not take baths, swim, or use a hot tub until your doctor says it is okay. Ask your doctor if you can take showers. You may only be allowed to take sponge baths for bathing. °· Follow your doctor's advice about exercise, driving, and general activities. Ask your doctor what activities are safe for you. °· Do not lift anything that is heavier than 10 lb (4.5 kg), or the limit that your doctor tells you, until he or she says that it is safe. °· Try to have someone at home with you for the first 1-2 weeks to help. °This information is not intended to replace advice given to you by your health care provider. Make sure you discuss any questions you have with your health care provider. °Document Released: 05/15/2008 Document Revised: 07/25/2016 Document Reviewed: 07/25/2016 °Elsevier Interactive Patient Education © 2017 [employer] ° °

## 2018-07-31 NOTE — Discharge Summary (Signed)
Physician Discharge Summary  Patient ID: Renee Alexander MRN: [id number] DOB/AGE: 12-06-1970 47 y.o.  Admit date: 07/29/2018 Discharge date: 07/31/2018  Admission Diagnoses: fibroid uterus, abnormal uterine bleeding  Discharge Diagnoses:  Principal Problem:   Fibroid uterus Active Problems:   Hypokalemia   Anemia   HTN (hypertension)   Intraductal papilloma of left breast   Abnormal uterine bleeding   Discharged Condition: good  Hospital Course: Please see HPI dated 07/29/2018 for full details. Briefly, this is a 47 y.o. [id number] female admitted for TAH,BS. Her hospital course was uncomplicated. By POD#2 she was eating without nausea/vomiting, voiding spontaneously without issue, passing flatus. Her pain was well controlled on PO pain meds and she was ambulating without difficulty.She was discharged home POD#2 in good condition. She will f/u in office  Medical history significant for HTN, DM  Physical Exam:  BP (!) 162/82 (BP Location: Left Arm)   Pulse 78   Temp 98.5 F (36.9 C) (Oral)   Resp 16   Ht 5\' 4"  (1.626 m)   Wt 103 kg   LMP 07/07/2018 (Exact Date)   SpO2 100%   BMI 38.98 kg/m  General: alert, oriented, cooperative Chest: CTAB, normal respiratory effort Heart: RRR  Abdomen: +BS, soft, appropriately tender to palpation, incision covered by dressing with no evidence of active bleeding  DVT Evaluation: no evidence of DVT Extremities: no edema, no calf tenderness   Labs: Lab Results  Component Value Date   WBC 7.9 07/30/2018   HGB 8.6 (L) 07/30/2018   HCT 26.7 (L) 07/30/2018   MCV 85.0 07/30/2018   PLT 230 07/30/2018   CMP Latest Ref Rng & Units 07/30/2018  Glucose 70 - 99 mg/dL 133(H)  BUN 6 - 20 mg/dL 19  Creatinine 0.44 - 1.00 mg/dL 1.53(H)  Sodium 135 - 145 mmol/L 138  Potassium 3.5 - 5.1 mmol/L 3.3(L)  Chloride 98 - 111 mmol/L 102  CO2 22 - 32 mmol/L 26  Calcium 8.9 - 10.3 mg/dL 8.7(L)  Total Protein 6.5 - 8.1 g/dL -  Total Bilirubin 0.3  - 1.2 mg/dL -  Alkaline Phos 38 - 126 U/L -  AST 15 - 41 U/L -  ALT 0 - 44 U/L -      Disposition: Discharge disposition: 01-Home or Self Care       Discharge Instructions    Call MD for:  difficulty breathing, headache or visual disturbances   Complete by:  As directed    Call MD for:  extreme fatigue   Complete by:  As directed    Call MD for:  hives   Complete by:  As directed    Call MD for:  persistant dizziness or light-headedness   Complete by:  As directed    Call MD for:  persistant nausea and vomiting   Complete by:  As directed    Call MD for:  redness, tenderness, or signs of infection (pain, swelling, redness, odor or green/yellow discharge around incision site)   Complete by:  As directed    Call MD for:  severe uncontrolled pain   Complete by:  As directed    Call MD for:  temperature >100.4   Complete by:  As directed    Diet - low sodium heart healthy   Complete by:  As directed    Increase activity slowly   Complete by:  As directed      An After Visit Summary was printed and given to the patient. Allergies as of 07/31/2018  Reactions   Penicillins Hives, Other (See Comments)   Has patient had a PCN reaction causing immediate rash, facial/tongue/throat swelling, SOB or lightheadedness with hypotension: No Has patient had a PCN reaction causing severe rash involving mucus membranes or skin necrosis: No Has patient had a PCN reaction that required hospitalization No Has patient had a PCN reaction occurring within the last 10 years: }No If all of the above answers are "NO", then may proceed with Cephalosporin       Medication List    STOP taking these medications   lisinopril 5 MG tablet Commonly known as:  PRINIVIL,ZESTRIL     TAKE these medications   albuterol 108 (90 Base) MCG/ACT inhaler Commonly known as:  PROVENTIL HFA;VENTOLIN HFA Inhale 2 puffs into the lungs every 6 (six) hours as needed for wheezing or shortness of breath.     amLODipine 10 MG tablet Commonly known as:  NORVASC Take 1 tablet (10 mg total) by mouth daily.   HAIR/SKIN/NAILS PO Take 1 tablet by mouth daily.   losartan 100 MG tablet Commonly known as:  COZAAR Take 100 mg by mouth daily.   metFORMIN 500 MG tablet Commonly known as:  GLUCOPHAGE Take 500 mg by mouth 2 (two) times daily with a meal.   metoprolol succinate 100 MG 24 hr tablet Commonly known as:  TOPROL-XL Take 100 mg by mouth daily. Take with or immediately following a meal.   omeprazole 20 MG capsule Commonly known as:  PRILOSEC Take 20 mg by mouth daily.   oxyCODONE 5 MG immediate release tablet Commonly known as:  Oxy IR/ROXICODONE Take 1-2 tablets (5-10 mg total) by mouth every 6 (six) hours as needed for moderate pain.   polyethylene glycol packet Commonly known as:  MIRALAX / GLYCOLAX Take 17 g by mouth 2 (two) times daily.   Potassium Chloride ER 20 MEQ Tbcr Take 20 mEq by mouth daily.   pravastatin 20 MG tablet Commonly known as:  PRAVACHOL Take 20 mg by mouth daily.   triamterene-hydrochlorothiazide 37.5-25 MG tablet Commonly known as:  MAXZIDE-25 Take 1 tablet by mouth daily.      Follow-up Information    [facility]. Go in 2 week(s).   Specialty:  Obstetrics and Gynecology Contact information: [address], [place] [phone]          Signed: Sloan Leiter 07/31/2018, 10:00 AM

## 2018-07-31 NOTE — Progress Notes (Signed)
Pt out with family teaching complete

## 2018-07-31 NOTE — Progress Notes (Signed)
    Gynecology Progress Note  Admission Date: 07/29/2018 Current Date: 07/31/2018 8:50 AM  Renee Alexander is a 47 y.o. [id number] POD#2 admitted s/p TAH, BS   History complicated by: Patient Active Problem List   Diagnosis Date Noted  . Fibroid uterus 07/29/2018  . Abnormal uterine bleeding 07/29/2018  . Intraductal papilloma of left breast 04/09/2018  . Hypertensive urgency 03/08/2017  . Orthostatic hypotension 03/08/2017  . Anemia 06/17/2015  . Shortness of breath 06/17/2015  . HTN (hypertension) 06/17/2015  . Iron deficiency anemia 05/07/2013  . Weakness generalized 05/06/2013  . Acute blood loss anemia 05/06/2013  . Hypokalemia 05/06/2013  . Chest pain 05/06/2013    Subjective:  Patient feeling well this am, pain much better controlled. She was able to rest. Eating with minimal nausea this am. Ambulating without issue. Voiding without issue, had BM this am. Denies chest pain, difficulty breathing, dizziness.   Objective:   Vitals:   07/30/18 2355 07/31/18 0436 07/31/18 0503 07/31/18 0847  BP: (!) 148/79 (!) 161/86 (!) 154/79 (!) 162/82  Pulse: 70 81 74 78  Resp: 17 17  16   Temp: 98.5 F (36.9 C) 98.9 F (37.2 C)  98.5 F (36.9 C)  TempSrc: Oral Oral  Oral  SpO2: 99% 99% 97% 100%  Weight:      Height:        No intake/output data recorded.  Intake/Output Summary (Last 24 hours) at 07/31/2018 0850 Last data filed at 07/30/2018 1845 Gross per 24 hour  Intake -  Output 1400 ml  Net -1400 ml     Physical exam: BP (!) 162/82 (BP Location: Left Arm)   Pulse 78   Temp 98.5 F (36.9 C) (Oral)   Resp 16   Ht 5\' 4"  (1.626 m)   Wt 103 kg   LMP 07/07/2018 (Exact Date)   SpO2 100%   BMI 38.98 kg/m  CONSTITUTIONAL: Well-developed, well-nourished female in no acute distress.  HENT:  Normocephalic, atraumatic, External right and left ear normal. Oropharynx is clear and moist EYES: Conjunctivae and EOM are normal. Pupils are equal, round, and reactive to  light. No scleral icterus.  NECK: Normal range of motion, supple, no masses.  Normal thyroid.  SKIN: Skin is warm and dry. No rash noted. Not diaphoretic. No erythema. No pallor. NEUROLOGIC: Alert and oriented to person, place, and time. Normal reflexes, muscle tone coordination. No cranial nerve deficit noted. PSYCHIATRIC: Normal mood and affect. Normal behavior. Normal judgment and thought content. CARDIOVASCULAR: Normal heart rate noted, regular rhythm RESPIRATORY: Clear to auscultation bilaterally. Effort and breath sounds normal, no problems with respiration noted. ABDOMEN: Soft, normal bowel sounds, no distention noted. incision clean/dry/intact with no evidence of active bleeding,   PELVIC: deferred MUSCULOSKELETAL: Normal range of motion. No tenderness.  No cyanosis, clubbing, or edema.    UOP: voiding spontaneously  Labs  Recent Labs  Lab 07/28/18 1440 07/30/18 0547  WBC 6.1 7.9  HGB 10.4* 8.6*  HCT 32.0* 26.7*  PLT 258 230     Assessment & Plan:   Patient is 47 y.o. [id number] POD#2 s/p TAH, BS. She is doing very well this am, pain much more well controlled and able to attend ADLs. For discharge home today.  Regular diet Cont home meds Pain meds prn    Feliz Beam, M.D. Attending [facility] for [employer] [facility])

## 2018-08-05 ENCOUNTER — Ambulatory Visit (INDEPENDENT_AMBULATORY_CARE_PROVIDER_SITE_OTHER): Payer: 59 | Admitting: Obstetrics

## 2018-08-05 VITALS — BP 141/90 | HR 73 | Wt 216.6 lb

## 2018-08-05 DIAGNOSIS — Z86018 Personal history of other benign neoplasm: Secondary | ICD-10-CM

## 2018-08-05 DIAGNOSIS — Z90710 Acquired absence of both cervix and uterus: Secondary | ICD-10-CM

## 2018-08-05 NOTE — Progress Notes (Signed)
Pt is here for staple removal. Pt states that she is in some pain and took her pain medication around 4am. Pt denies any bleeding but states that she is having some drainage from incision.

## 2018-08-05 NOTE — Progress Notes (Signed)
Patient ID: Renee Alexander, female   DOB: 08/14/1971, 47 y.o.   MRN: [id number]  No chief complaint on file.   HPI Renee Alexander is a 47 y.o. female.  S/P TAH for uterine fibroids, AUB and anemia.  Presents for removal of staples. HPI  Past Medical History:  Diagnosis Date  . Anemia   . Asthma    rarely uses inhaler  . Diabetes mellitus without complication ([place])    type 2  . Genital herpes   . GERD (gastroesophageal reflux disease)   . History of blood transfusion    [facility]  . History of low potassium   . Hypertension   . Intraductal papilloma of left breast 04/09/2018  . Papilloma of left breast   . SVD (spontaneous vaginal delivery)    x 4    Past Surgical History:  Procedure Laterality Date  . BREAST LUMPECTOMY WITH RADIOACTIVE SEED LOCALIZATION Left 04/09/2018   Procedure: BREAST LUMPECTOMY WITH RADIOACTIVE SEED LOCALIZATION;  Surgeon: Fanny Skates, MD;  Location: [facility];  Service: General;  Laterality: Left;  . CESAREAN SECTION     x 1-twins  . HYSTERECTOMY ABDOMINAL WITH SALPINGECTOMY Bilateral 07/29/2018   Procedure: HYSTERECTOMY ABDOMINAL WITH SALPINGECTOMY;  Surgeon: Sloan Leiter, MD;  Location: [facility];  Service: Gynecology;  Laterality: Bilateral;  . TUBAL LIGATION     x2  . WISDOM TOOTH EXTRACTION      Family History  Problem Relation Age of Onset  . Drug abuse Mother   . Diabetes Mellitus II Father   . Liver disease Father   . Breast cancer Sister     Social History Social History   Tobacco Use  . Smoking status: Never Smoker  . Smokeless tobacco: Never Used  Substance Use Topics  . Alcohol use: No  . Drug use: No    Allergies  Allergen Reactions  . Penicillins Hives and Other (See Comments)    Has patient had a PCN reaction causing immediate rash, facial/tongue/throat swelling, SOB or lightheadedness with hypotension: No Has patient had a PCN reaction causing severe rash involving mucus membranes or skin necrosis:  No Has patient had a PCN reaction that required hospitalization No Has patient had a PCN reaction occurring within the last 10 years: }No If all of the above answers are "NO", then may proceed with Cephalosporin     Current Outpatient Medications  Medication Sig Dispense Refill  . albuterol (PROVENTIL HFA;VENTOLIN HFA) 108 (90 BASE) MCG/ACT inhaler Inhale 2 puffs into the lungs every 6 (six) hours as needed for wheezing or shortness of breath. (Patient taking differently: Inhale 2 puffs into the lungs every 6 (six) hours as needed for wheezing or shortness of breath. ) 1 Inhaler 2  . amLODipine (NORVASC) 10 MG tablet Take 1 tablet (10 mg total) by mouth daily. 30 tablet 0  . Biotin w/ Vitamins C & E (HAIR/SKIN/NAILS PO) Take 1 tablet by mouth daily.    Marland Kitchen losartan (COZAAR) 100 MG tablet Take 100 mg by mouth daily.    . metFORMIN (GLUCOPHAGE) 500 MG tablet Take 500 mg by mouth 2 (two) times daily with a meal.     . metoprolol succinate (TOPROL-XL) 100 MG 24 hr tablet Take 100 mg by mouth daily. Take with or immediately following a meal.    . omeprazole (PRILOSEC) 20 MG capsule Take 20 mg by mouth daily.    Marland Kitchen oxyCODONE (OXY IR/ROXICODONE) 5 MG immediate release tablet Take 1-2 tablets (5-10 mg total)  by mouth every 6 (six) hours as needed for moderate pain. 30 tablet 0  . polyethylene glycol (MIRALAX / GLYCOLAX) packet Take 17 g by mouth 2 (two) times daily. (Patient not taking: Reported on 06/26/2018) 14 each 0  . Potassium Chloride ER 20 MEQ TBCR Take 20 mEq by mouth daily.     . pravastatin (PRAVACHOL) 20 MG tablet Take 20 mg by mouth daily.    Marland Kitchen triamterene-hydrochlorothiazide (MAXZIDE-25) 37.5-25 MG tablet Take 1 tablet by mouth daily.     No current facility-administered medications for this visit.     Review of Systems Review of Systems Constitutional: negative for fatigue and weight loss Respiratory: negative for cough and wheezing Cardiovascular: negative for chest pain, fatigue and  palpitations Gastrointestinal: negative for abdominal pain and change in bowel habits Genitourinary:negative Integument/breast: negative for nipple discharge Musculoskeletal:negative for myalgias Neurological: negative for gait problems and tremors Behavioral/Psych: negative for abusive relationship, depression Endocrine: negative for temperature intolerance      Blood pressure (!) 141/90, pulse 73, weight 216 lb 9.6 oz (98.2 kg), last menstrual period 07/07/2018.  Physical Exam Physical Exam General:   alert  Skin:   no rash or abnormalities  Lungs:   clear to auscultation bilaterally  Heart:   regular rate and rhythm, S1, S2 normal, no murmur, click, rub or gallop             Abdomen:  Incision clean, dry and intact.  Staples removed and steri-strips applied  50% of visit spent on counseling and coordination of care  Data Reviewed Pathology  Assessment     1. S/P TAH (total abdominal hysterectomy) - doing well  2. History of uterine fibroid     Plan    Follow up in 6 weeks  No orders of the defined types were placed in this encounter.  No orders of the defined types were placed in this encounter.   Shelly Bombard MD 08-05-2018

## 2018-08-14 ENCOUNTER — Encounter: Payer: Self-pay | Admitting: Obstetrics and Gynecology

## 2018-08-14 ENCOUNTER — Ambulatory Visit (INDEPENDENT_AMBULATORY_CARE_PROVIDER_SITE_OTHER): Payer: 59 | Admitting: Obstetrics and Gynecology

## 2018-08-14 VITALS — BP 129/83 | HR 62 | Wt 214.0 lb

## 2018-08-14 DIAGNOSIS — N898 Other specified noninflammatory disorders of vagina: Secondary | ICD-10-CM | POA: Diagnosis not present

## 2018-08-14 DIAGNOSIS — D250 Submucous leiomyoma of uterus: Secondary | ICD-10-CM

## 2018-08-14 DIAGNOSIS — Z113 Encounter for screening for infections with a predominantly sexual mode of transmission: Secondary | ICD-10-CM

## 2018-08-14 DIAGNOSIS — Z9889 Other specified postprocedural states: Secondary | ICD-10-CM

## 2018-08-14 MED ORDER — OXYCODONE HCL 5 MG PO TABS: 5.0000 mg | ORAL_TABLET | Freq: Four times a day (QID) | ORAL | 0 refills | Status: DC | PRN

## 2018-08-14 NOTE — Progress Notes (Signed)
GYNECOLOGY OFFICE FOLLOW UP NOTE  History:  47 y.o. [id number] here today for follow up for TAH, BS on 07/29/18. Had staple removed in office after surgery. She is doing well, taking pain pills as needed, not regularly. She had a bout of nausea/vomiting that self-resolved approx 1 week after surgery. Appetite improving but not back to normal yet. She is having no issues with going to the restroom and currently, denies nausea/vomiting. States she is moving slowly and overdid it on Tuesday but overall, doing well. Denies any vaginal bleeding.    Past Medical History:  Diagnosis Date  . Anemia   . Asthma    rarely uses inhaler  . Diabetes mellitus without complication ([place])    type 2  . Genital herpes   . GERD (gastroesophageal reflux disease)   . History of blood transfusion    [facility]  . History of low potassium   . Hypertension   . Intraductal papilloma of left breast 04/09/2018  . Papilloma of left breast   . SVD (spontaneous vaginal delivery)    x 4    Past Surgical History:  Procedure Laterality Date  . BREAST LUMPECTOMY WITH RADIOACTIVE SEED LOCALIZATION Left 04/09/2018   Procedure: BREAST LUMPECTOMY WITH RADIOACTIVE SEED LOCALIZATION;  Surgeon: Fanny Skates, MD;  Location: [facility];  Service: General;  Laterality: Left;  . CESAREAN SECTION     x 1-twins  . HYSTERECTOMY ABDOMINAL WITH SALPINGECTOMY Bilateral 07/29/2018   Procedure: HYSTERECTOMY ABDOMINAL WITH SALPINGECTOMY;  Surgeon: Sloan Leiter, MD;  Location: [facility];  Service: Gynecology;  Laterality: Bilateral;  . TUBAL LIGATION     x2  . WISDOM TOOTH EXTRACTION       Current Outpatient Medications:  .  albuterol (PROVENTIL HFA;VENTOLIN HFA) 108 (90 BASE) MCG/ACT inhaler, Inhale 2 puffs into the lungs every 6 (six) hours as needed for wheezing or shortness of breath. (Patient taking differently: Inhale 2 puffs into the lungs every 6 (six) hours as needed for wheezing or shortness of breath. ), Disp: 1  Inhaler, Rfl: 2 .  amLODipine (NORVASC) 10 MG tablet, Take 1 tablet (10 mg total) by mouth daily., Disp: 30 tablet, Rfl: 0 .  Biotin w/ Vitamins C & E (HAIR/SKIN/NAILS PO), Take 1 tablet by mouth daily., Disp: , Rfl:  .  losartan (COZAAR) 100 MG tablet, Take 100 mg by mouth daily., Disp: , Rfl:  .  metFORMIN (GLUCOPHAGE) 500 MG tablet, Take 500 mg by mouth 2 (two) times daily with a meal. , Disp: , Rfl:  .  metoprolol succinate (TOPROL-XL) 100 MG 24 hr tablet, Take 100 mg by mouth daily. Take with or immediately following a meal., Disp: , Rfl:  .  omeprazole (PRILOSEC) 20 MG capsule, Take 20 mg by mouth daily., Disp: , Rfl:  .  oxyCODONE (OXY IR/ROXICODONE) 5 MG immediate release tablet, Take 1-2 tablets (5-10 mg total) by mouth every 6 (six) hours as needed for moderate pain., Disp: 20 tablet, Rfl: 0 .  polyethylene glycol (MIRALAX / GLYCOLAX) packet, Take 17 g by mouth 2 (two) times daily. (Patient not taking: Reported on 06/26/2018), Disp: 14 each, Rfl: 0 .  Potassium Chloride ER 20 MEQ TBCR, Take 20 mEq by mouth daily. , Disp: , Rfl:  .  pravastatin (PRAVACHOL) 20 MG tablet, Take 20 mg by mouth daily., Disp: , Rfl:  .  triamterene-hydrochlorothiazide (MAXZIDE-25) 37.5-25 MG tablet, Take 1 tablet by mouth daily., Disp: , Rfl:   The following portions of the patient's  history were reviewed and updated as appropriate: allergies, current medications, past family history, past medical history, past social history, past surgical history and problem list.   Review of Systems:  Pertinent items noted in HPI and remainder of comprehensive ROS otherwise negative.   Objective:  Physical Exam BP 129/83   Pulse 62   Wt 214 lb (97.1 kg)   BMI 36.73 kg/m  CONSTITUTIONAL: Well-developed, well-nourished female in no acute distress.  HENT:  Normocephalic, atraumatic. External right and left ear normal. Oropharynx is clear and moist EYES: Conjunctivae and EOM are normal. Pupils are equal, round, and  reactive to light. No scleral icterus.  NECK: Normal range of motion, supple, no masses SKIN: Skin is warm and dry. No rash noted. Not diaphoretic. No erythema. No pallor. NEUROLOGIC: Alert and oriented to person, place, and time. Normal reflexes, muscle tone coordination. No cranial nerve deficit noted. PSYCHIATRIC: Normal mood and affect. Normal behavior. Normal judgment and thought content. CARDIOVASCULAR: Normal heart rate noted RESPIRATORY: Effort normal, no problems with respiration noted ABDOMEN: Soft, no distention noted. Incision appears well approximated and intact, mildly tender to touch PELVIC: Normal appearing external genitalia; normal appearing vaginal mucosa, cervix surgically absent and cuff appears intact, small amount white discharge, Uterus surgically absent, no tenderness or palpable masses, cuff palpates intact MUSCULOSKELETAL: Normal range of motion. No edema noted.  Labs and Imaging Diagnosis Uterus, cervix and bilateral fallopian tubes - UTERUS: -ENDOMETRIUM: WEAKLY SECRETORY ENDOMETRIUM. NO HYPERPLASIA OR MALIGNANCY. -MYOMETRIUM: LEIOMYOMATA. ADENOMYOSIS. NO MALIGNANCY. -SEROSA: UNREMARKABLE. NO MALIGNANCY. - CERVIX: BENIGN SQUAMOUS AND ENDOCERVICAL MUCOSA. NO DYSPLASIA OR MALIGNANCY. - BILATERAL FALLOPIAN TUBES: PARATUBAL CYSTS. NO MALIGNANCY. Vicente Males MD Pathologist, Electronic Signature (Case signed 07/31/2018)  Assessment & Plan:   1. Submucous leiomyoma of uterus Benign path  2. Postoperative state Doing well Resent prescription for pain meds Reviewed no intercourse, nothing in vagina until cleared next visit  3. Vaginal discharge - Cervicovaginal ancillary only( [place])   Routine preventative health maintenance measures emphasized. Please refer to After Visit Summary for other counseling recommendations.   Return in about 4 weeks (around 09/11/2018) for Followup.   Feliz Beam, M.D. Attending Center for [employer]  Fish farm manager)

## 2018-08-18 LAB — CERVICOVAGINAL ANCILLARY ONLY
BACTERIAL VAGINITIS: NEGATIVE
Candida vaginitis: NEGATIVE
Chlamydia: NEGATIVE
Neisseria Gonorrhea: NEGATIVE
Trichomonas: POSITIVE — AB

## 2018-08-21 MED ORDER — METRONIDAZOLE 500 MG PO TABS: ORAL_TABLET | ORAL | 0 refills | Status: DC

## 2018-08-21 NOTE — Addendum Note (Signed)
Addended by: Vivien Rota on: 08/21/2018 09:24 AM   Modules accepted: Orders

## 2018-09-15 ENCOUNTER — Encounter: Payer: Self-pay | Admitting: Obstetrics and Gynecology

## 2018-09-15 ENCOUNTER — Ambulatory Visit (INDEPENDENT_AMBULATORY_CARE_PROVIDER_SITE_OTHER): Payer: 59 | Admitting: Obstetrics and Gynecology

## 2018-09-15 VITALS — BP 152/85 | HR 67 | Wt 219.1 lb

## 2018-09-15 DIAGNOSIS — A599 Trichomoniasis, unspecified: Secondary | ICD-10-CM

## 2018-09-15 DIAGNOSIS — Z113 Encounter for screening for infections with a predominantly sexual mode of transmission: Secondary | ICD-10-CM

## 2018-09-15 DIAGNOSIS — Z9889 Other specified postprocedural states: Secondary | ICD-10-CM

## 2018-09-15 DIAGNOSIS — N898 Other specified noninflammatory disorders of vagina: Secondary | ICD-10-CM

## 2018-09-15 NOTE — Progress Notes (Signed)
Pt presents to f/u s/p TAH, BTLS, and Cytoscopy

## 2018-09-15 NOTE — Progress Notes (Signed)
GYNECOLOGY OFFICE FOLLOW UP NOTE  History:  48 y.o. [id number] here today for follow up for TAH, BS. Pain has been better, she is doing okay with extra strength tylenol. Does not need to take narcotics for pain. Saw a little bit of blood with wiping but has not seen any since. Has not had any bowel/bladder issues.  States she took Flagyl for trichomonas and had partner retested, his was negative but took treatment anyway.   Past Medical History:  Diagnosis Date  . Anemia   . Asthma    rarely uses inhaler  . Diabetes mellitus without complication ([place])    type 2  . Genital herpes   . GERD (gastroesophageal reflux disease)   . History of blood transfusion    [facility]  . History of low potassium   . Hypertension   . Intraductal papilloma of left breast 04/09/2018  . Papilloma of left breast   . SVD (spontaneous vaginal delivery)    x 4   Past Surgical History:  Procedure Laterality Date  . BREAST LUMPECTOMY WITH RADIOACTIVE SEED LOCALIZATION Left 04/09/2018   Procedure: BREAST LUMPECTOMY WITH RADIOACTIVE SEED LOCALIZATION;  Surgeon: Fanny Skates, MD;  Location: [facility];  Service: General;  Laterality: Left;  . CESAREAN SECTION     x 1-twins  . HYSTERECTOMY ABDOMINAL WITH SALPINGECTOMY Bilateral 07/29/2018   Procedure: HYSTERECTOMY ABDOMINAL WITH SALPINGECTOMY;  Surgeon: Sloan Leiter, MD;  Location: [facility];  Service: Gynecology;  Laterality: Bilateral;  . TUBAL LIGATION     x2  . WISDOM TOOTH EXTRACTION      Current Outpatient Medications:  .  amLODipine (NORVASC) 10 MG tablet, Take 1 tablet (10 mg total) by mouth daily., Disp: 30 tablet, Rfl: 0 .  Biotin w/ Vitamins C & E (HAIR/SKIN/NAILS PO), Take 1 tablet by mouth daily., Disp: , Rfl:  .  losartan (COZAAR) 100 MG tablet, Take 100 mg by mouth daily., Disp: , Rfl:  .  metFORMIN (GLUCOPHAGE) 500 MG tablet, Take 500 mg by mouth 2 (two) times daily with a meal. , Disp: , Rfl:  .  metoprolol succinate (TOPROL-XL)  100 MG 24 hr tablet, Take 100 mg by mouth daily. Take with or immediately following a meal., Disp: , Rfl:  .  omeprazole (PRILOSEC) 20 MG capsule, Take 20 mg by mouth daily., Disp: , Rfl:  .  Potassium Chloride ER 20 MEQ TBCR, Take 20 mEq by mouth daily. , Disp: , Rfl:  .  pravastatin (PRAVACHOL) 20 MG tablet, Take 20 mg by mouth daily., Disp: , Rfl:  .  triamterene-hydrochlorothiazide (MAXZIDE-25) 37.5-25 MG tablet, Take 1 tablet by mouth daily., Disp: , Rfl:  .  albuterol (PROVENTIL HFA;VENTOLIN HFA) 108 (90 BASE) MCG/ACT inhaler, Inhale 2 puffs into the lungs every 6 (six) hours as needed for wheezing or shortness of breath. (Patient not taking: Reported on 09/15/2018), Disp: 1 Inhaler, Rfl: 2 .  metroNIDAZOLE (FLAGYL) 500 MG tablet, Take two tablets by mouth twice a day, for one day.  Or you can take all four tablets at once if you can tolerate it. (Patient not taking: Reported on 09/15/2018), Disp: 4 tablet, Rfl: 0 .  oxyCODONE (OXY IR/ROXICODONE) 5 MG immediate release tablet, Take 1-2 tablets (5-10 mg total) by mouth every 6 (six) hours as needed for moderate pain. (Patient not taking: Reported on 09/15/2018), Disp: 20 tablet, Rfl: 0 .  polyethylene glycol (MIRALAX / GLYCOLAX) packet, Take 17 g by mouth 2 (two) times daily. (Patient not  taking: Reported on 06/26/2018), Disp: 14 each, Rfl: 0  The following portions of the patient's history were reviewed and updated as appropriate: allergies, current medications, past family history, past medical history, past social history, past surgical history and problem list.   Review of Systems:  Pertinent items noted in HPI and remainder of comprehensive ROS otherwise negative.   Objective:  Physical Exam BP (!) 152/85   Pulse 67   Wt 219 lb 1.6 oz (99.4 kg)   LMP 07/07/2018 (Exact Date)   BMI 37.61 kg/m  CONSTITUTIONAL: Well-developed, well-nourished female in no acute distress.  HENT:  Normocephalic, atraumatic. External right and left ear normal.  Oropharynx is clear and moist EYES: Conjunctivae and EOM are normal. Pupils are equal, round, and reactive to light. No scleral icterus.  NECK: Normal range of motion, supple, no masses SKIN: Skin is warm and dry. No rash noted. Not diaphoretic. No erythema. No pallor. NEUROLOGIC: Alert and oriented to person, place, and time. Normal reflexes, muscle tone coordination. No cranial nerve deficit noted. PSYCHIATRIC: Normal mood and affect. Normal behavior. Normal judgment and thought content. CARDIOVASCULAR: Normal heart rate noted RESPIRATORY: Effort normal, no problems with respiration noted ABDOMEN: Soft, no distention noted.   PELVIC: Normal appearing external genitalia; normal appearing vaginal mucosa, cuff appears intact, increased amount thick white discharge.  pelvic cultures obtained. Cuff palpated intact, mild discomfort but no pain with exam, no adnexal masses or tenderness. MUSCULOSKELETAL: Normal range of motion. No edema noted.  Labs and Imaging No results found.  Assessment & Plan:  1. Postoperative state Doing well, cleared for intercourse Okay to return to work  2. Trichomonas infection - Patient reports husband again tested negative but took medications given her repeat positive tests, reviewed possibility of false negative tests for him vs medication failure - Will retest today and if again tests positive, will plan for metrogel use rather than flagyl - Cervicovaginal ancillary only( [place])  3. Vaginal discharge Swab for yeast/BV sent   Routine preventative health maintenance measures emphasized. Please refer to After Visit Summary for other counseling recommendations.   Return in about 1 year (around 09/16/2019) for annual.   Feliz Beam, M.D. Attending Center for [employer] Fish farm manager)

## 2018-09-16 LAB — CERVICOVAGINAL ANCILLARY ONLY
Bacterial vaginitis: NEGATIVE
Candida vaginitis: NEGATIVE
Chlamydia: NEGATIVE
Neisseria Gonorrhea: NEGATIVE
Trichomonas: POSITIVE — AB

## 2018-09-18 MED ORDER — TINIDAZOLE 500 MG PO TABS: 2.0000 g | ORAL_TABLET | Freq: Once | ORAL | 1 refills | Status: AC

## 2018-09-18 NOTE — Addendum Note (Signed)
Addended by: Vivien Rota on: 09/18/2018 11:30 AM   Modules accepted: Orders

## 2018-10-27 ENCOUNTER — Ambulatory Visit
Admission: RE | Admit: 2018-10-27 | Discharge: 2018-10-27 | Disposition: A | Discharge status: Home / Self Care | Payer: 59 | Source: Ambulatory Visit | Attending: Urgent Care | Admitting: Urgent Care

## 2018-10-27 ENCOUNTER — Other Ambulatory Visit: Payer: Self-pay | Admitting: Urgent Care

## 2018-10-27 DIAGNOSIS — M542 Cervicalgia: Secondary | ICD-10-CM

## 2018-12-05 ENCOUNTER — Other Ambulatory Visit: Payer: Self-pay | Admitting: Nephrology

## 2018-12-05 DIAGNOSIS — N183 Chronic kidney disease, stage 3 unspecified: Secondary | ICD-10-CM

## 2018-12-23 ENCOUNTER — Inpatient Hospital Stay: Admission: RE | Admit: 2018-12-23 | Payer: 59 | Source: Ambulatory Visit

## 2019-01-07 ENCOUNTER — Other Ambulatory Visit: Payer: Self-pay | Admitting: Nephrology

## 2019-01-07 DIAGNOSIS — E269 Hyperaldosteronism, unspecified: Secondary | ICD-10-CM

## 2019-01-08 ENCOUNTER — Other Ambulatory Visit: Payer: 59

## 2019-01-13 ENCOUNTER — Ambulatory Visit
Admission: RE | Admit: 2019-01-13 | Discharge: 2019-01-13 | Disposition: A | Discharge status: Home / Self Care | Payer: 59 | Source: Ambulatory Visit | Attending: Nephrology | Admitting: Nephrology

## 2019-01-13 ENCOUNTER — Other Ambulatory Visit: Payer: Self-pay

## 2019-01-13 DIAGNOSIS — E269 Hyperaldosteronism, unspecified: Secondary | ICD-10-CM

## 2019-03-11 ENCOUNTER — Other Ambulatory Visit (HOSPITAL_COMMUNITY): Payer: Self-pay | Admitting: Nephrology

## 2019-03-11 DIAGNOSIS — R809 Proteinuria, unspecified: Secondary | ICD-10-CM

## 2019-03-11 DIAGNOSIS — I129 Hypertensive chronic kidney disease with stage 1 through stage 4 chronic kidney disease, or unspecified chronic kidney disease: Secondary | ICD-10-CM

## 2019-03-16 ENCOUNTER — Other Ambulatory Visit: Payer: Self-pay | Admitting: Student

## 2019-03-17 ENCOUNTER — Ambulatory Visit (HOSPITAL_COMMUNITY)
Admission: RE | Admit: 2019-03-17 | Discharge: 2019-03-17 | Disposition: A | Discharge status: Home / Self Care | Payer: 59 | Source: Ambulatory Visit | Attending: Nephrology | Admitting: Nephrology

## 2019-03-17 ENCOUNTER — Other Ambulatory Visit: Payer: Self-pay

## 2019-03-17 DIAGNOSIS — J45909 Unspecified asthma, uncomplicated: Secondary | ICD-10-CM | POA: Diagnosis not present

## 2019-03-17 DIAGNOSIS — N189 Chronic kidney disease, unspecified: Secondary | ICD-10-CM | POA: Insufficient documentation

## 2019-03-17 DIAGNOSIS — R809 Proteinuria, unspecified: Secondary | ICD-10-CM | POA: Insufficient documentation

## 2019-03-17 DIAGNOSIS — Z79899 Other long term (current) drug therapy: Secondary | ICD-10-CM | POA: Diagnosis not present

## 2019-03-17 DIAGNOSIS — Z7984 Long term (current) use of oral hypoglycemic drugs: Secondary | ICD-10-CM | POA: Insufficient documentation

## 2019-03-17 DIAGNOSIS — I129 Hypertensive chronic kidney disease with stage 1 through stage 4 chronic kidney disease, or unspecified chronic kidney disease: Secondary | ICD-10-CM | POA: Diagnosis present

## 2019-03-17 DIAGNOSIS — E1122 Type 2 diabetes mellitus with diabetic chronic kidney disease: Secondary | ICD-10-CM | POA: Insufficient documentation

## 2019-03-17 DIAGNOSIS — K219 Gastro-esophageal reflux disease without esophagitis: Secondary | ICD-10-CM | POA: Diagnosis not present

## 2019-03-17 LAB — BASIC METABOLIC PANEL
Anion gap: 8 (ref 5–15)
BUN: 30 mg/dL — ABNORMAL HIGH (ref 6–20)
CO2: 20 mmol/L — ABNORMAL LOW (ref 22–32)
Calcium: 9.7 mg/dL (ref 8.9–10.3)
Chloride: 109 mmol/L (ref 98–111)
Creatinine, Ser: 2.08 mg/dL — ABNORMAL HIGH (ref 0.44–1.00)
GFR calc Af Amer: 32 mL/min — ABNORMAL LOW (ref >60)
GFR calc non Af Amer: 27 mL/min — ABNORMAL LOW (ref >60)
Glucose, Bld: 116 mg/dL — ABNORMAL HIGH (ref 70–99)
Potassium: 3.9 mmol/L (ref 3.5–5.1)
Sodium: 137 mmol/L (ref 135–145)

## 2019-03-17 LAB — GLUCOSE, CAPILLARY
Glucose-Capillary: 97 mg/dL (ref 70–99)
Glucose-Capillary: 100 mg/dL — ABNORMAL HIGH (ref 70–99)

## 2019-03-17 LAB — PROTIME-INR
INR: 1 (ref 0.8–1.2)
Prothrombin Time: 13.4 seconds (ref 11.4–15.2)

## 2019-03-17 LAB — CBC
HCT: 30.7 % — ABNORMAL LOW (ref 36.0–46.0)
Hemoglobin: 9.8 g/dL — ABNORMAL LOW (ref 12.0–15.0)
MCH: 28 pg (ref 26.0–34.0)
MCHC: 31.9 g/dL (ref 30.0–36.0)
MCV: 87.7 fL (ref 80.0–100.0)
Platelets: 225 10*3/uL (ref 150–400)
RBC: 3.5 MIL/uL — ABNORMAL LOW (ref 3.87–5.11)
RDW: 14.8 % (ref 11.5–15.5)
WBC: 4.9 10*3/uL (ref 4.0–10.5)
nRBC: 0 % (ref 0.0–0.2)

## 2019-03-17 MED ORDER — HYDROCODONE-ACETAMINOPHEN 5-325 MG PO TABS: 1.0000 | ORAL_TABLET | ORAL | Status: DC | PRN

## 2019-03-17 MED ORDER — LIDOCAINE HCL (PF) 1 % IJ SOLN
INTRAMUSCULAR | Status: AC
  Filled 2019-03-17: qty 30

## 2019-03-17 MED ORDER — MIDAZOLAM HCL 2 MG/2ML IJ SOLN
INTRAMUSCULAR | Status: AC | PRN
  Administered 2019-03-17 (×2): 1 mg via INTRAVENOUS

## 2019-03-17 MED ORDER — GELATIN ABSORBABLE 12-7 MM EX MISC
CUTANEOUS | Status: AC
  Filled 2019-03-17: qty 1

## 2019-03-17 MED ORDER — SODIUM CHLORIDE 0.9 % IV SOLN: INTRAVENOUS | Status: DC

## 2019-03-17 MED ORDER — FENTANYL CITRATE (PF) 100 MCG/2ML IJ SOLN
INTRAMUSCULAR | Status: AC
  Filled 2019-03-17: qty 2

## 2019-03-17 MED ORDER — HYDRALAZINE HCL 20 MG/ML IJ SOLN
INTRAMUSCULAR | Status: AC | PRN
  Administered 2019-03-17: 5 mg via INTRAVENOUS

## 2019-03-17 MED ORDER — MIDAZOLAM HCL 2 MG/2ML IJ SOLN
INTRAMUSCULAR | Status: AC
  Filled 2019-03-17: qty 2

## 2019-03-17 MED ORDER — HYDRALAZINE HCL 20 MG/ML IJ SOLN
INTRAMUSCULAR | Status: AC
  Filled 2019-03-17: qty 1

## 2019-03-17 MED ORDER — FENTANYL CITRATE (PF) 100 MCG/2ML IJ SOLN
INTRAMUSCULAR | Status: AC | PRN
  Administered 2019-03-17: 50 ug via INTRAVENOUS
  Administered 2019-03-17: 25 ug via INTRAVENOUS

## 2019-03-17 NOTE — Discharge Instructions (Signed)
Percutaneous Kidney Biopsy, Care After °This sheet gives you information about how to care for yourself after your procedure. Your health care provider may also give you more specific instructions. If you have problems or questions, contact your health care provider. °What can I expect after the procedure? °After the procedure, it is common to have: °· Pain or soreness near the area where the needle went through your skin (biopsy site). °· Bright pink or cloudy urine for 24 hours after the procedure. °Follow these instructions at home: °Activity °· Return to your normal activities as told by your health care provider. Ask your health care provider what activities are safe for you. °· Do not drive for 24 hours if you were given a medicine to help you relax (sedative). °· Do not lift anything that is heavier than 10 lb (4.5 kg) until your health care provider tells you that it is safe. °· Avoid activities that take a lot of effort (are strenuous) until your health care provider approves. Most people will have to wait 2 weeks before returning to activities such as exercise or sexual intercourse. °General instructions ° °· Take over-the-counter and prescription medicines only as told by your health care provider. °· You may eat and drink after your procedure. Follow instructions from your health care provider about eating or drinking restrictions. °· Check your biopsy site every day for signs of infection. Check for: °? More redness, swelling, or pain. °? More fluid or blood. °? Warmth. °? Pus or a bad smell. °· Keep all follow-up visits as told by your health care provider. This is important. °Contact a health care provider if: °· You have more redness, swelling, or pain around your biopsy site. °· You have more fluid or blood coming from your biopsy site. °· Your biopsy site feels warm to the touch. °· You have pus or a bad smell coming from your biopsy site. °· You have blood in your urine more than 24 hours after  your procedure. °Get help right away if: °· You have dark red or brown urine. °· You have a fever. °· You are unable to urinate. °· You feel burning when you urinate. °· You feel faint. °· You have severe pain in your abdomen or side. °This information is not intended to replace advice given to you by your health care provider. Make sure you discuss any questions you have with your health care provider. °Document Released: 04/08/2013 Document Revised: 07/19/2017 Document Reviewed: 05/18/2016 °Elsevier Patient Education © 2020 [employer] Inc[employer] ° °

## 2019-03-17 NOTE — H&P (Signed)
Chief Complaint: Patient was seen in consultation today for hypertension, renal disease  Referring Physician(s): Sanford,Ryan B  Supervising Physician: Markus Daft  Patient Status: [facility] - Out-pt  History of Present Illness: Renee Alexander is a 48 y.o. female with past medical history of asthma, DM, GERD, HTN with progressive renal failure.  Request is made for random renal biopsy at the request of Dr. Joelyn Oms.   Patient presents to [facility] in her usual state of health.  She has been NPO.  She does not take blood thinners.   Past Medical History:  Diagnosis Date   Anemia    Asthma    rarely uses inhaler   Diabetes mellitus without complication ([place])    type 2   Genital herpes    GERD (gastroesophageal reflux disease)    History of blood transfusion    [facility]   History of low potassium    Hypertension    Intraductal papilloma of left breast 04/09/2018   Papilloma of left breast    SVD (spontaneous vaginal delivery)    x 4    Past Surgical History:  Procedure Laterality Date   BREAST LUMPECTOMY WITH RADIOACTIVE SEED LOCALIZATION Left 04/09/2018   Procedure: BREAST LUMPECTOMY WITH RADIOACTIVE SEED LOCALIZATION;  Surgeon: Fanny Skates, MD;  Location: [facility];  Service: General;  Laterality: Left;   CESAREAN SECTION     x 1-twins   HYSTERECTOMY ABDOMINAL WITH SALPINGECTOMY Bilateral 07/29/2018   Procedure: HYSTERECTOMY ABDOMINAL WITH SALPINGECTOMY;  Surgeon: Sloan Leiter, MD;  Location: [facility];  Service: Gynecology;  Laterality: Bilateral;   TUBAL LIGATION     x2   WISDOM TOOTH EXTRACTION      Allergies: Penicillins  Medications: Prior to Admission medications   Medication Sig Start Date End Date Taking? Authorizing Provider  albuterol (PROVENTIL HFA;VENTOLIN HFA) 108 (90 BASE) MCG/ACT inhaler Inhale 2 puffs into the lungs every 6 (six) hours as needed for wheezing or shortness of breath. 06/19/15  Yes Regalado, Belkys A, MD   amLODipine (NORVASC) 10 MG tablet Take 1 tablet (10 mg total) by mouth daily. 06/19/15  Yes Regalado, Belkys A, MD  Biotin w/ Vitamins C & E (HAIR/SKIN/NAILS PO) Take 1 tablet by mouth daily.   Yes [provider]  hydrALAZINE (APRESOLINE) 25 MG tablet Take 25 mg by mouth 3 (three) times daily.   Yes [provider]  losartan (COZAAR) 100 MG tablet Take 100 mg by mouth daily.   Yes [provider]  metFORMIN (GLUCOPHAGE) 500 MG tablet Take 500 mg by mouth 2 (two) times daily with a meal.    Yes [provider]  metoprolol succinate (TOPROL-XL) 100 MG 24 hr tablet Take 100 mg by mouth daily. Take with or immediately following a meal.   Yes [provider]  omeprazole (PRILOSEC) 20 MG capsule Take 20 mg by mouth daily.   Yes [provider]  spironolactone (ALDACTONE) 100 MG tablet Take 100 mg by mouth daily.   Yes [provider]     Family History  Problem Relation Age of Onset   Drug abuse Mother    Diabetes Mellitus II Father    Liver disease Father    Breast cancer Sister     Social History   Socioeconomic History   Marital status: Single    Spouse name: Not on file   Number of children: Not on file   Years of education: Not on file   Highest education level: Not on  file  Occupational History   Not on file  Social Needs   Financial resource strain: Not on file   Food insecurity    Worry: Not on file    Inability: Not on file   Transportation needs    Medical: Not on file    Non-medical: Not on file  Tobacco Use   Smoking status: Never Smoker   Smokeless tobacco: Never Used  Substance and Sexual Activity   Alcohol use: No   Drug use: No   Sexual activity: Yes    Birth control/protection: Surgical  Lifestyle   Physical activity    Days per week: Not on file    Minutes per session: Not on file   Stress: Not on file  Relationships   Social connections    Talks on phone: Not on file      Gets together: Not on file    Attends religious service: Not on file    Active member of club or organization: Not on file    Attends meetings of clubs or organizations: Not on file    Relationship status: Not on file  Other Topics Concern   Not on file  Social History Narrative   Not on file     Review of Systems: A 12 point ROS discussed and pertinent positives are indicated in the HPI above.  All other systems are negative.  Review of Systems  Constitutional: Negative for fatigue and fever.  Respiratory: Negative for cough and shortness of breath.   Cardiovascular: Negative for chest pain.  Gastrointestinal: Negative for abdominal pain.  Genitourinary: Negative for dysuria.  Musculoskeletal: Negative for back pain.  Psychiatric/Behavioral: Negative for behavioral problems and confusion.    Vital Signs: BP (!) 179/86    Temp 98.5 F (36.9 C)    Resp 18    Ht 5\' 4"  (1.626 m)    Wt 221 lb (100.2 kg)    LMP 07/07/2018 (Exact Date)    SpO2 100%    BMI 37.93 kg/m   Physical Exam Vitals signs and nursing note reviewed.  Constitutional:      Appearance: Normal appearance.  HENT:     Mouth/Throat:     Mouth: Mucous membranes are moist.     Pharynx: Oropharynx is clear.  Cardiovascular:     Rate and Rhythm: Normal rate and regular rhythm.     Heart sounds: No murmur. No friction rub. No gallop.   Abdominal:     General: Abdomen is flat.     Palpations: Abdomen is soft.  Skin:    General: Skin is warm and dry.  Neurological:     Mental Status: She is alert.  Psychiatric:        Mood and Affect: Mood normal.        Behavior: Behavior normal.        Thought Content: Thought content normal.        Judgment: Judgment normal.      MD Evaluation Airway: WNL Heart: WNL Abdomen: WNL Chest/ Lungs: WNL ASA  Classification: 3 Mallampati/Airway Score: One   Imaging: No results found.  Labs:  CBC: Recent Labs    04/16/18 1552 06/27/18 1107 07/28/18 1440  07/30/18 0547  WBC 5.1 5.0 6.1 7.9  HGB 10.1* 10.2* 10.4* 8.6*  HCT 30.1* 30.8* 32.0* 26.7*  PLT 211 240 258 230    COAGS: No results for input(s): INR, APTT in the last 8760 hours.  BMP: Recent Labs    06/27/18 1107 07/01/18 1112  07/28/18 1440 07/30/18 0547  NA 138 138 137 138  K 2.7* 3.1* 3.2* 3.3*  CL 102 101 102 102  CO2 27 26 25 26   GLUCOSE 160* 116* 100* 133*  BUN 18 18 22* 19  CALCIUM 9.3 9.7 9.5 8.7*  CREATININE 1.47* 1.41* 1.59* 1.53*  GFRNONAA 41* 44* 38* 40*  GFRAA 48* 50* 44* 46*    LIVER FUNCTION TESTS: Recent Labs    04/04/18 1203  BILITOT 0.4  AST 28  ALT 23  ALKPHOS 47  PROT 6.8  ALBUMIN 3.4*    TUMOR MARKERS: No results for input(s): AFPTM, CEA, CA199, CHROMGRNA in the last 8760 hours.  Assessment and Plan: Patient with past medical history of hypertension, renal disease presents with complaint of worsening renal disease.  IR consulted for random renal biopsy at the request of Dr. Joelyn Oms. Case reviewed by Dr. Anselm Pancoast who approves patient for procedure.  Patient presents today in their usual state of health.  She has been NPO and is not currently on blood thinners.    Risks and benefits were discussed with the patient and/or patient's family including, but not limited to bleeding, infection, damage to adjacent structures or low yield requiring additional tests.  All of the questions were answered and there is agreement to proceed.  Consent signed and in chart.  Thank you for this interesting consult.  I greatly enjoyed meeting Renee Alexander and look forward to participating in their care.  A copy of this report was sent to the requesting provider on this date.  Electronically Signed: Docia Barrier, PA 03/17/2019, 7:39 AM   I spent a total of  30 Minutes   in face to face in clinical consultation, greater than 50% of which was counseling/coordinating care for renal disease, hypertension.

## 2019-03-17 NOTE — Procedures (Signed)
Interventional Radiology Procedure:   Indications: Hypertension, proteinuria  Procedure: US guided random renal biopsy  Findings: 2 cores from left kidney lower pole  Complications: None     EBL: less than 10 ml   Plan: Bedrest 4 hours and then discharge to home.     Juliene Kirsh R. Anselm Pancoast, MD  Pager: [phone]

## 2019-03-27 ENCOUNTER — Encounter (HOSPITAL_COMMUNITY): Payer: Self-pay | Admitting: Nephrology

## 2019-04-01 IMAGING — US US PELVIS COMPLETE TRANSABD/TRANSVAG
1 series · 15 of 25 positions shown · non-contrast
Comparison: None

CLINICAL DATA: Heavy menses, menorrhagia, enlarged uterus

EXAM:
TRANSABDOMINAL AND TRANSVAGINAL ULTRASOUND OF PELVIS
TECHNIQUE: Both transabdominal and transvaginal ultrasound examinations of the
pelvis were performed. Transabdominal technique was performed for
global imaging of the pelvis including uterus, ovaries, adnexal
regions, and pelvic cul-de-sac. It was necessary to proceed with
endovaginal exam following the transabdominal exam to visualize the
endometrium and LEFT ovary.

[Series 1: us pelvis complete transabd/transvag · 15 of 85 slices shown]
[im 1/85]
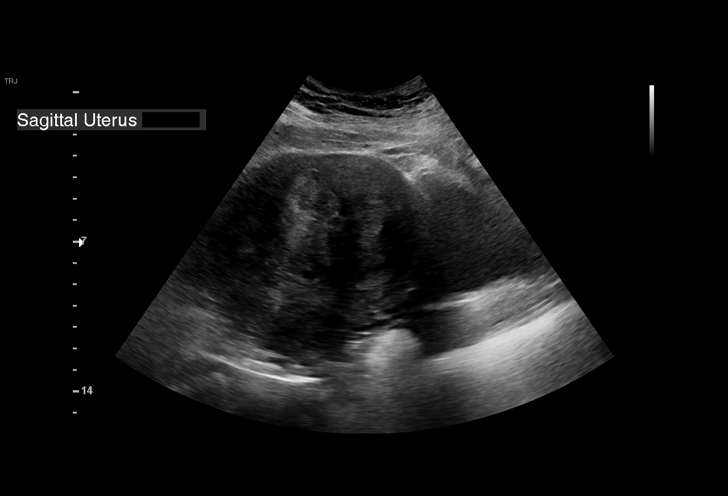
[im 8/85]
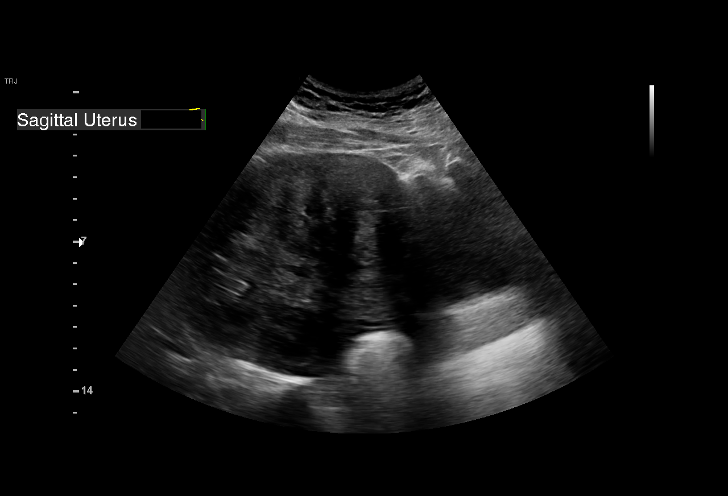
[im 15/85]
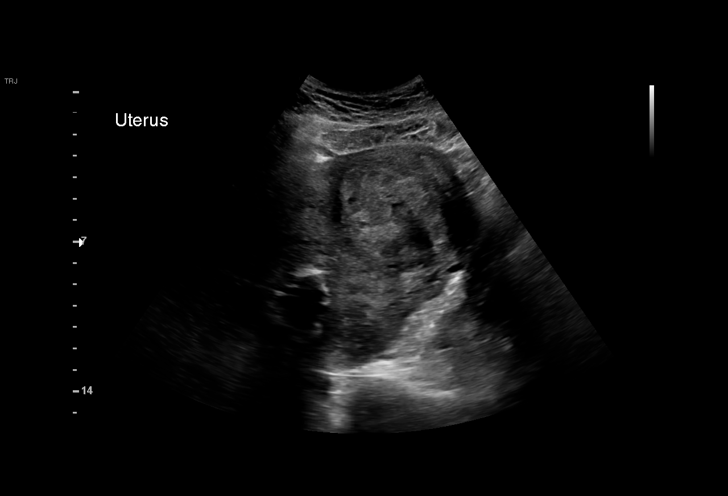
[im 18/85]
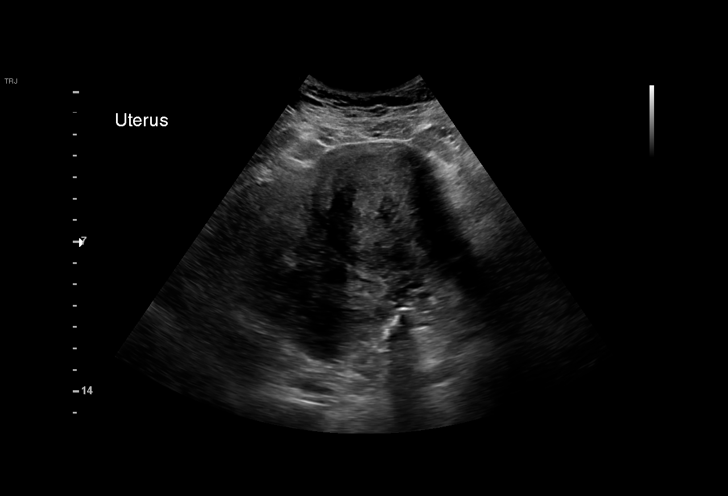
[im 25/85]
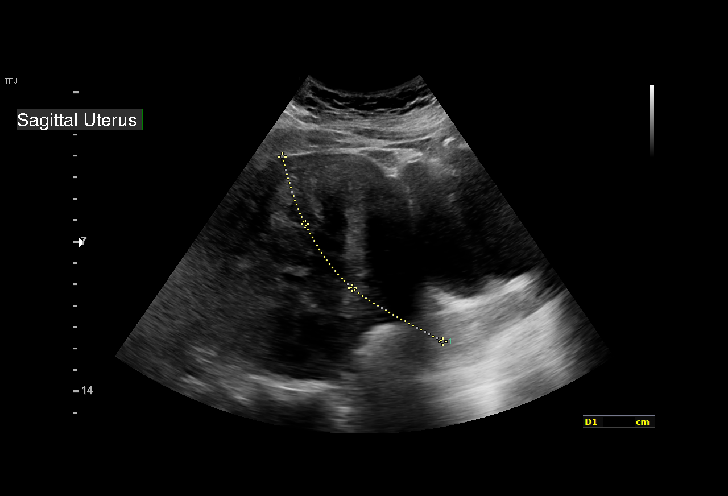
[im 32/85]
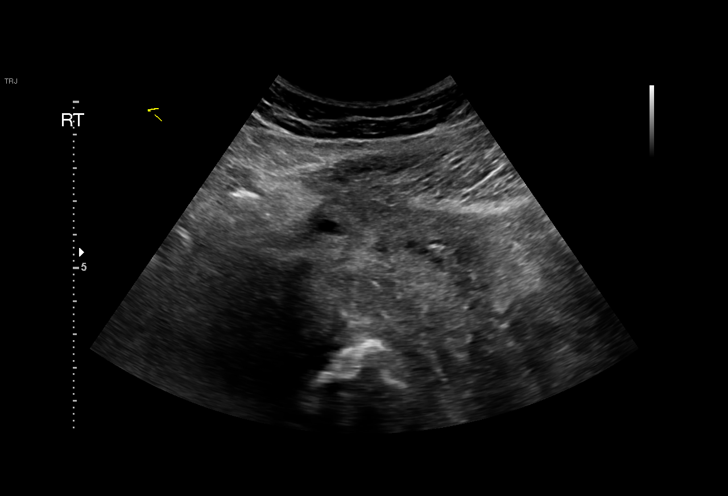
[im 36/85]
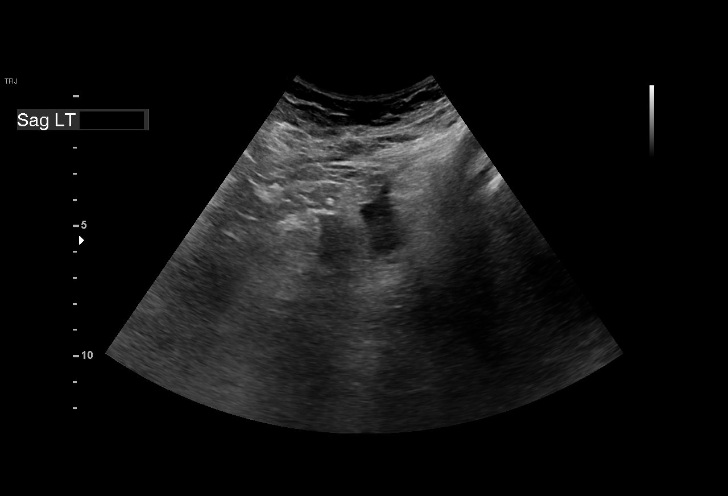
[im 43/85]
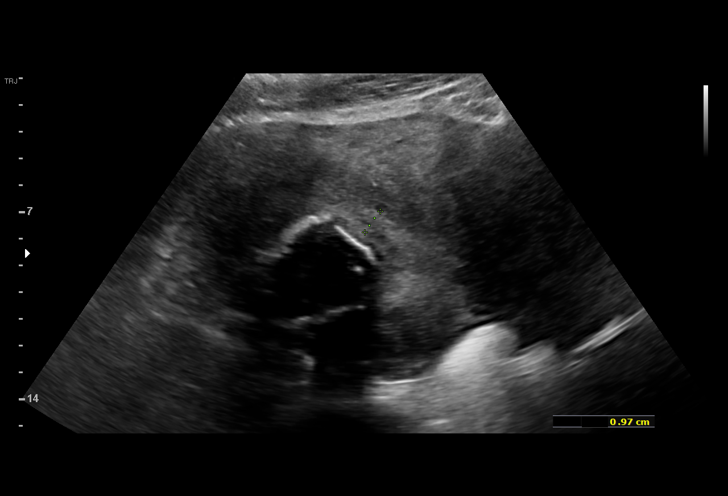
[im 50/85]
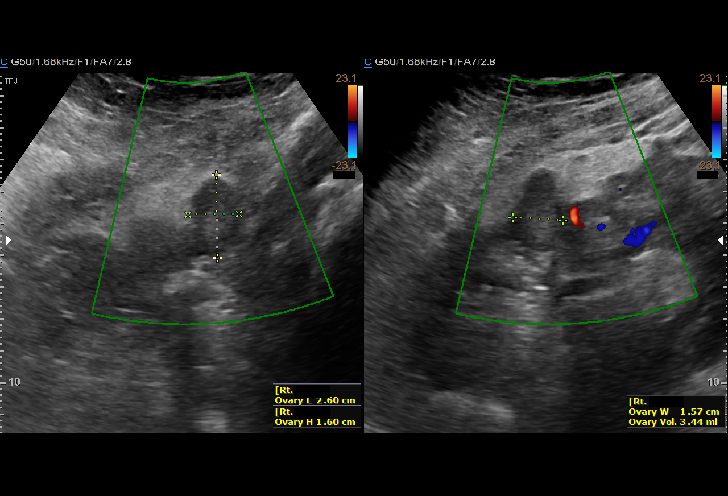
[im 53/85]
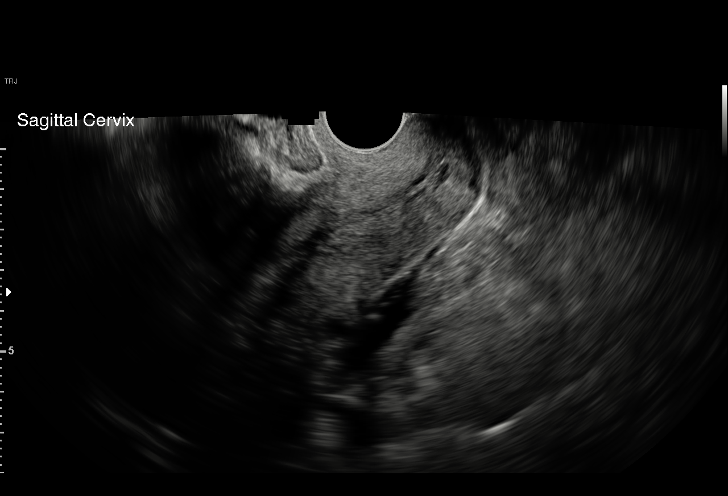
[im 60/85]
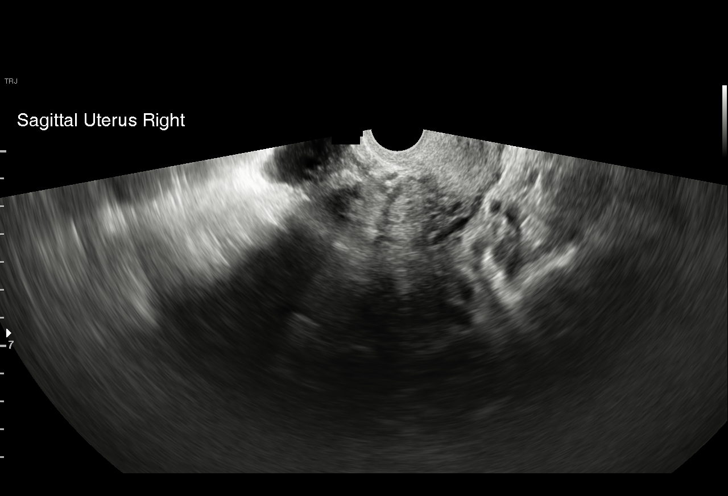
[im 67/85]
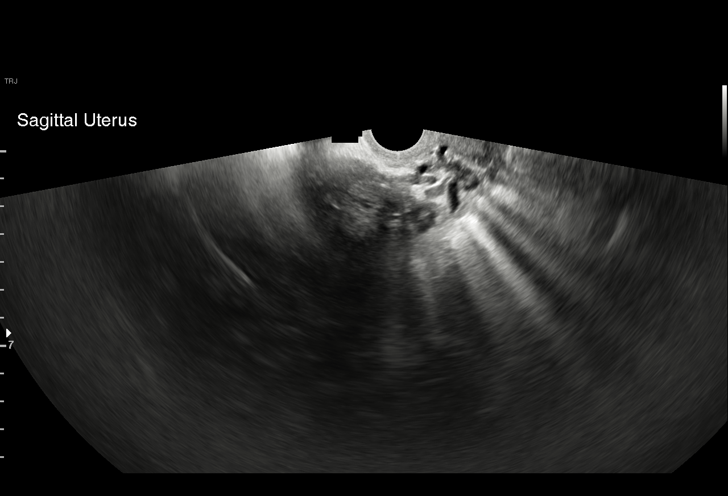
[im 71/85]
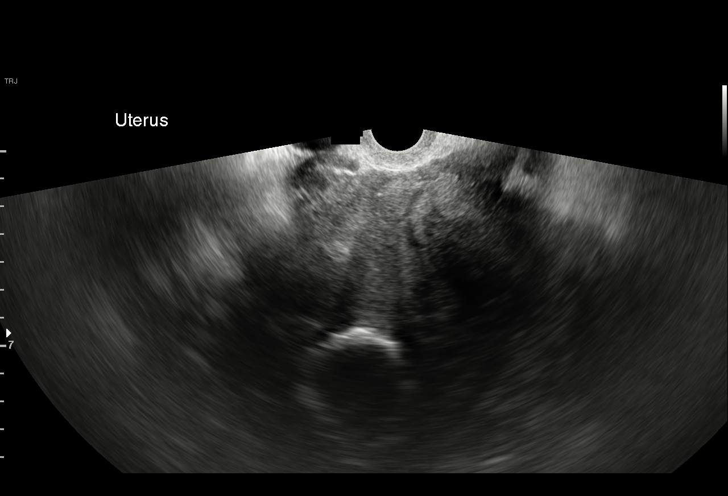
[im 78/85]
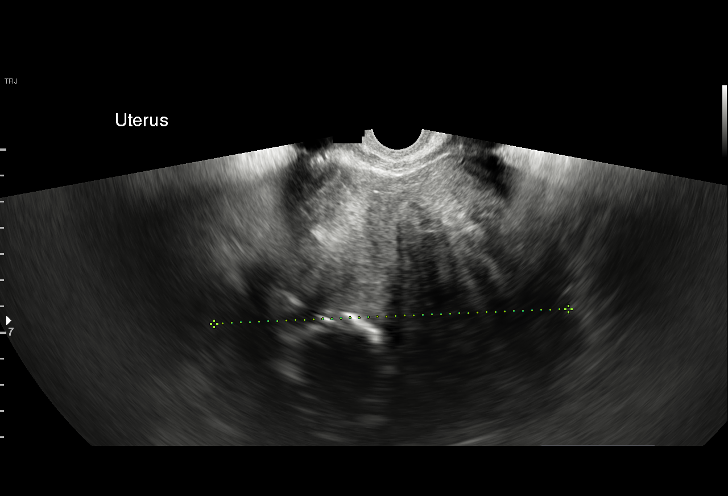
[im 85/85]
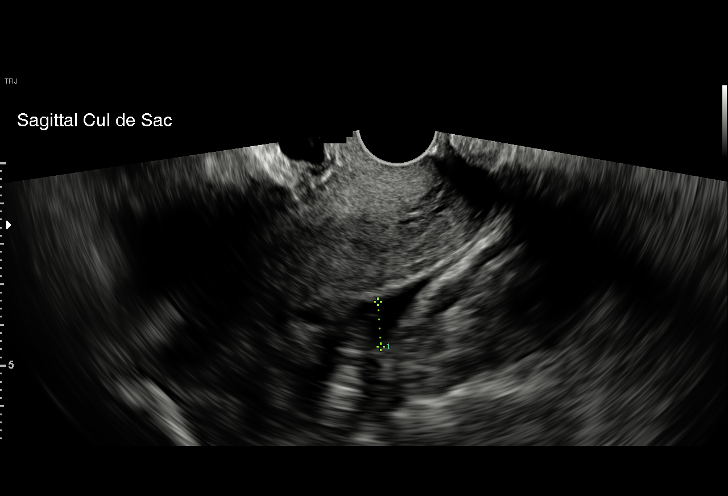

[15 of 25 positions shown; findings below may reference images not displayed]

FINDINGS: Uterus

Measurements: 14.3 x 10.8 x 14.5 cm. Enlarged nodular uterus
consistent with multiple leiomyomata. Large leiomyoma at the
anterior LEFT lateral aspect of uterus 8.7 x 6.6 x 7.4 cm, likely
transmural. Additional masses are seen at the RIGHT fundus 4.6 x
x 5.4 cm and posteriorly on RIGHT 5.1 x 5.1 x 5.2 cm partially
calcified and extending submucosal.

Endometrium

Thickness: 10 mm thick. Distorted by leiomyomata. No endometrial
fluid.

Right ovary

Measurements: 2.6 x 1.6 x 1.6 cm.  Normal morphology without mass

Left ovary

Measurements: 5.4 x 2.9 x 5.0 cm. Complex cystic lesion of the LEFT
ovary 4.2 x 4.3 x 3.0 cm containing numerous thin to slightly
thicker/irregular septations and a fluid fluid level.

Other findings

Trace free pelvic fluid.  No other adnexal masses.
IMPRESSION: Enlarged uterus containing multiple large leiomyomata.

Complicated cystic lesion of the LEFT ovary containing irregular
septations and independent fluid fluid level, 4.3 cm in greatest
size; cystic ovarian neoplasm not excluded.

Consider either surgical evaluation or further characterization by
MR.

## 2019-04-30 IMAGING — MG MM BREAST LOCALIZATION CLIP
6 series · 6 of 14 positions shown · non-contrast
Comparison: Previous exam(s).

CLINICAL DATA: Status post stereotactic core needle biopsy of a
left breast asymmetry and ultrasound-guided core needle biopsy of a
left breast intraductal mass.

EXAM:
DIAGNOSTIC LEFT MAMMOGRAM POST STEREOTACTIC AND ULTRASOUND GUIDED
BIOPSY

[L ML]
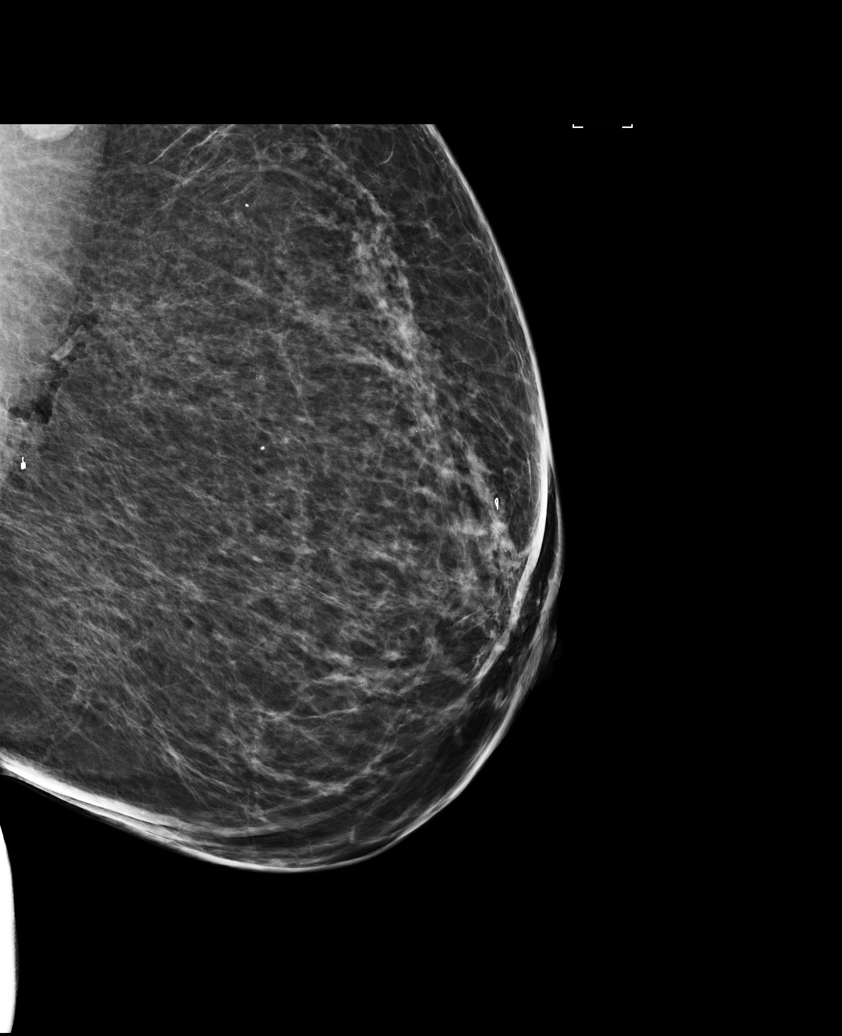

[L CC synth-2D]
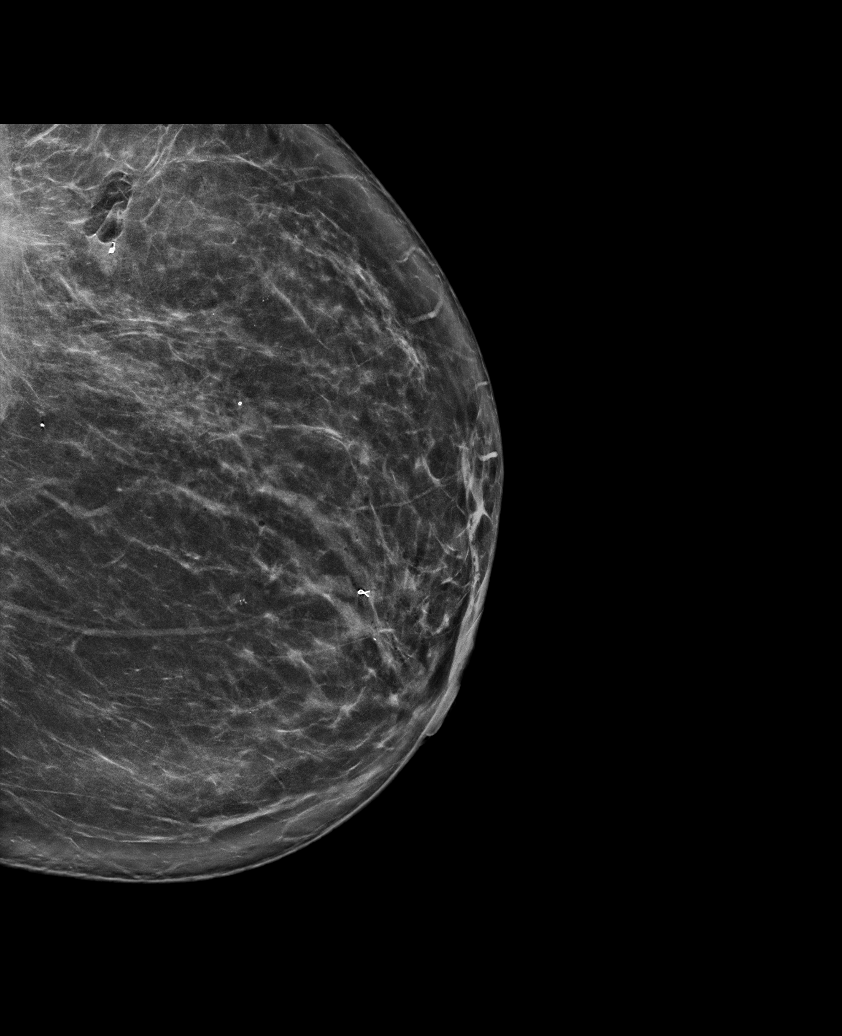

[L ML synth-2D]
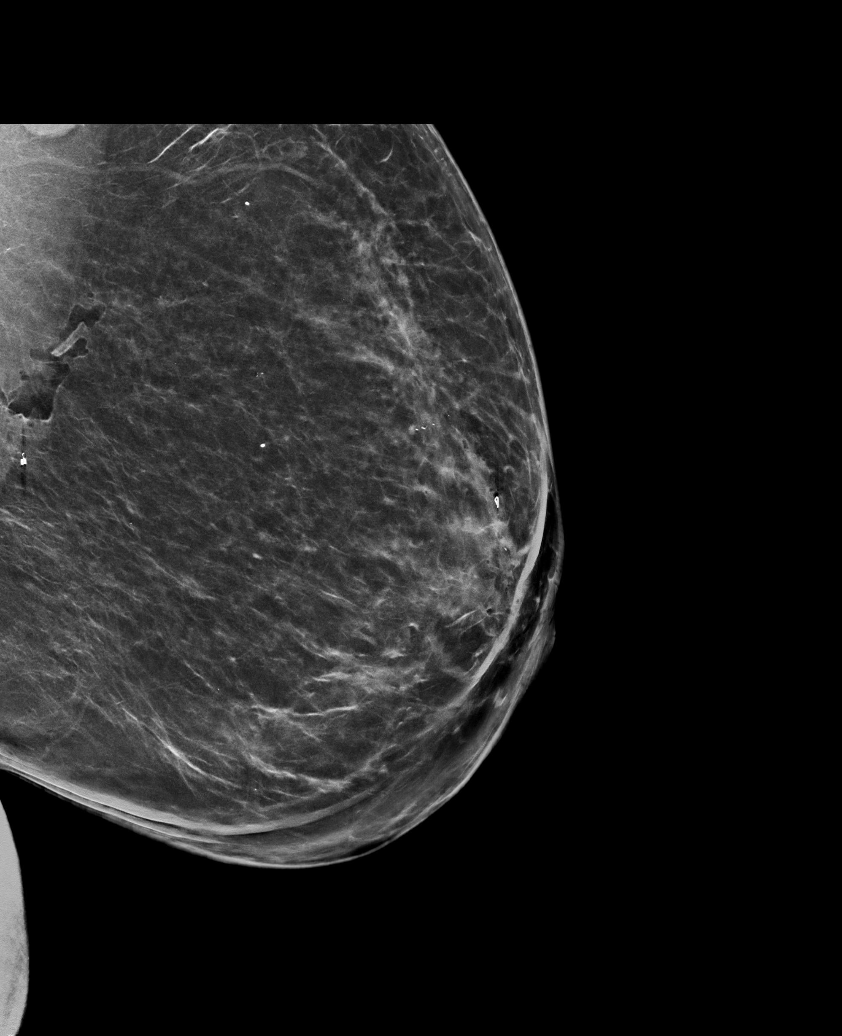

[L CC]
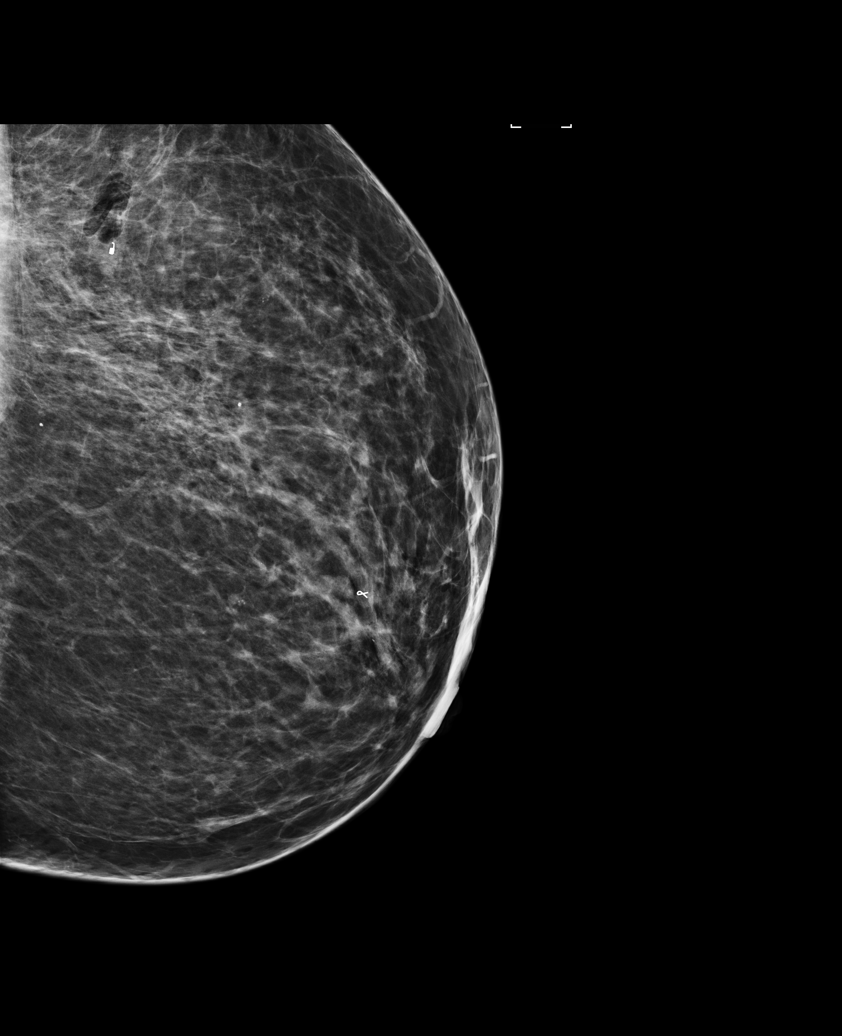

[L CC tomo · tomo slice 45/89.0]
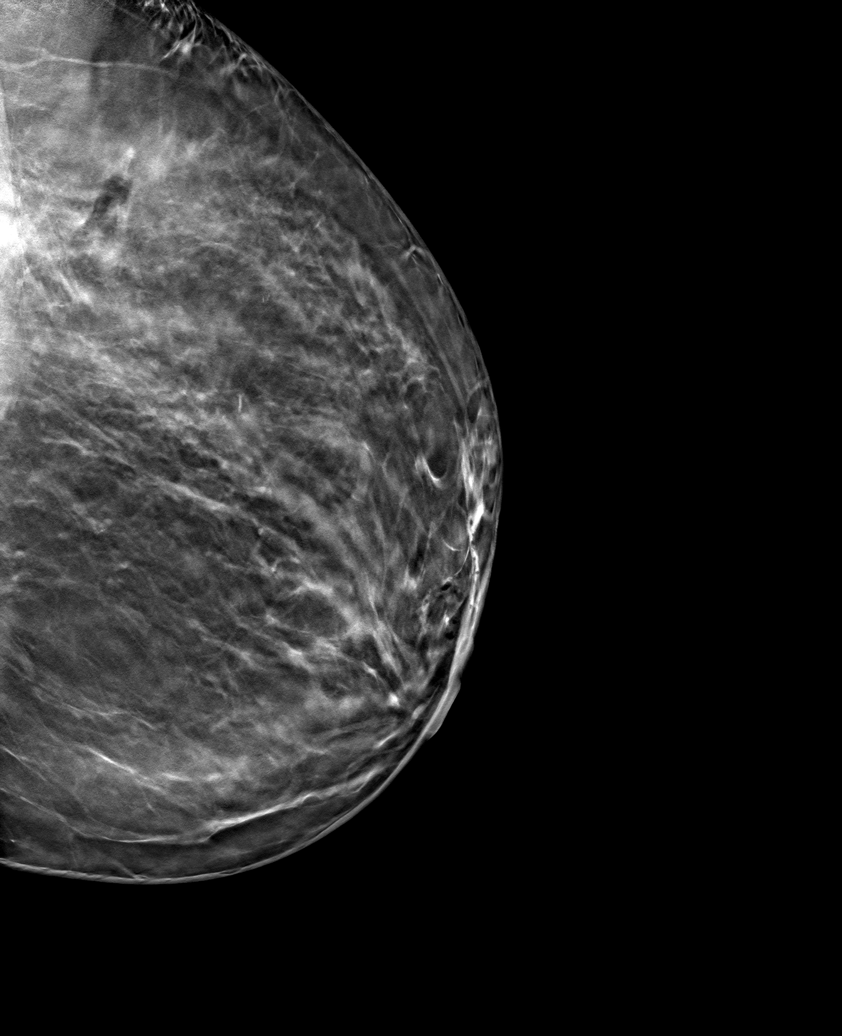

[L ML tomo · tomo slice 45/90.0]
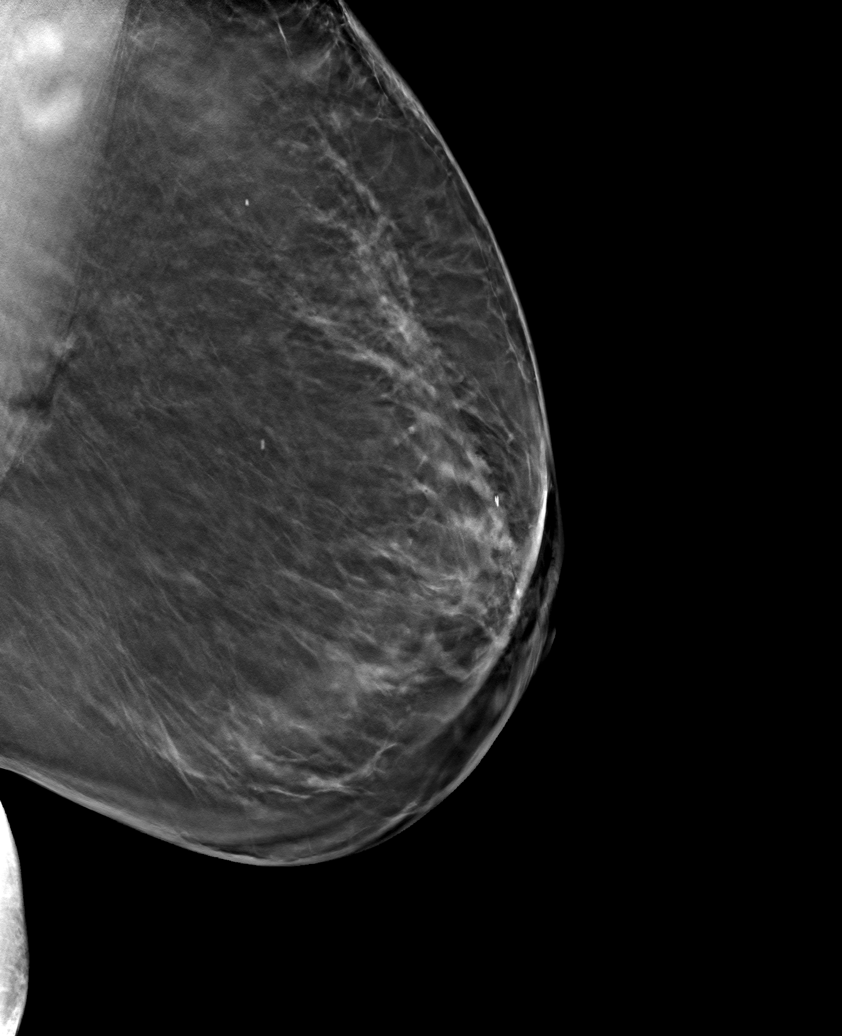

[6 of 14 positions shown; findings below may reference images not displayed]

FINDINGS: Mammographic images were obtained following stereotactic guided and
ultrasound guided biopsy of a lower outer quadrant posterior left
breast asymmetry and an intraductal mass respectively. The coil
shaped biopsy clip lies in the expected location of the lower outer
quadrant asymmetry and the ribbon shaped biopsy clip lies in the
anterior superior left breast in the expected location of the
intraductal mass.
IMPRESSION: Well-positioned biopsy clips following stereotactic guided and
ultrasound-guided core needle biopsy of 2 left breast lesions.

Final Assessment: Post Procedure Mammograms for Marker Placement

## 2019-06-04 IMAGING — MG BREAST SURGICAL SPECIMEN
1 series · 1 of 1 positions shown · non-contrast
Comparison: Previous exam(s).

CLINICAL DATA: Status post radioactive seed localized excision of
LEFT breast papilloma.

EXAM:
SPECIMEN RADIOGRAPH OF THE LEFT BREAST

[L]
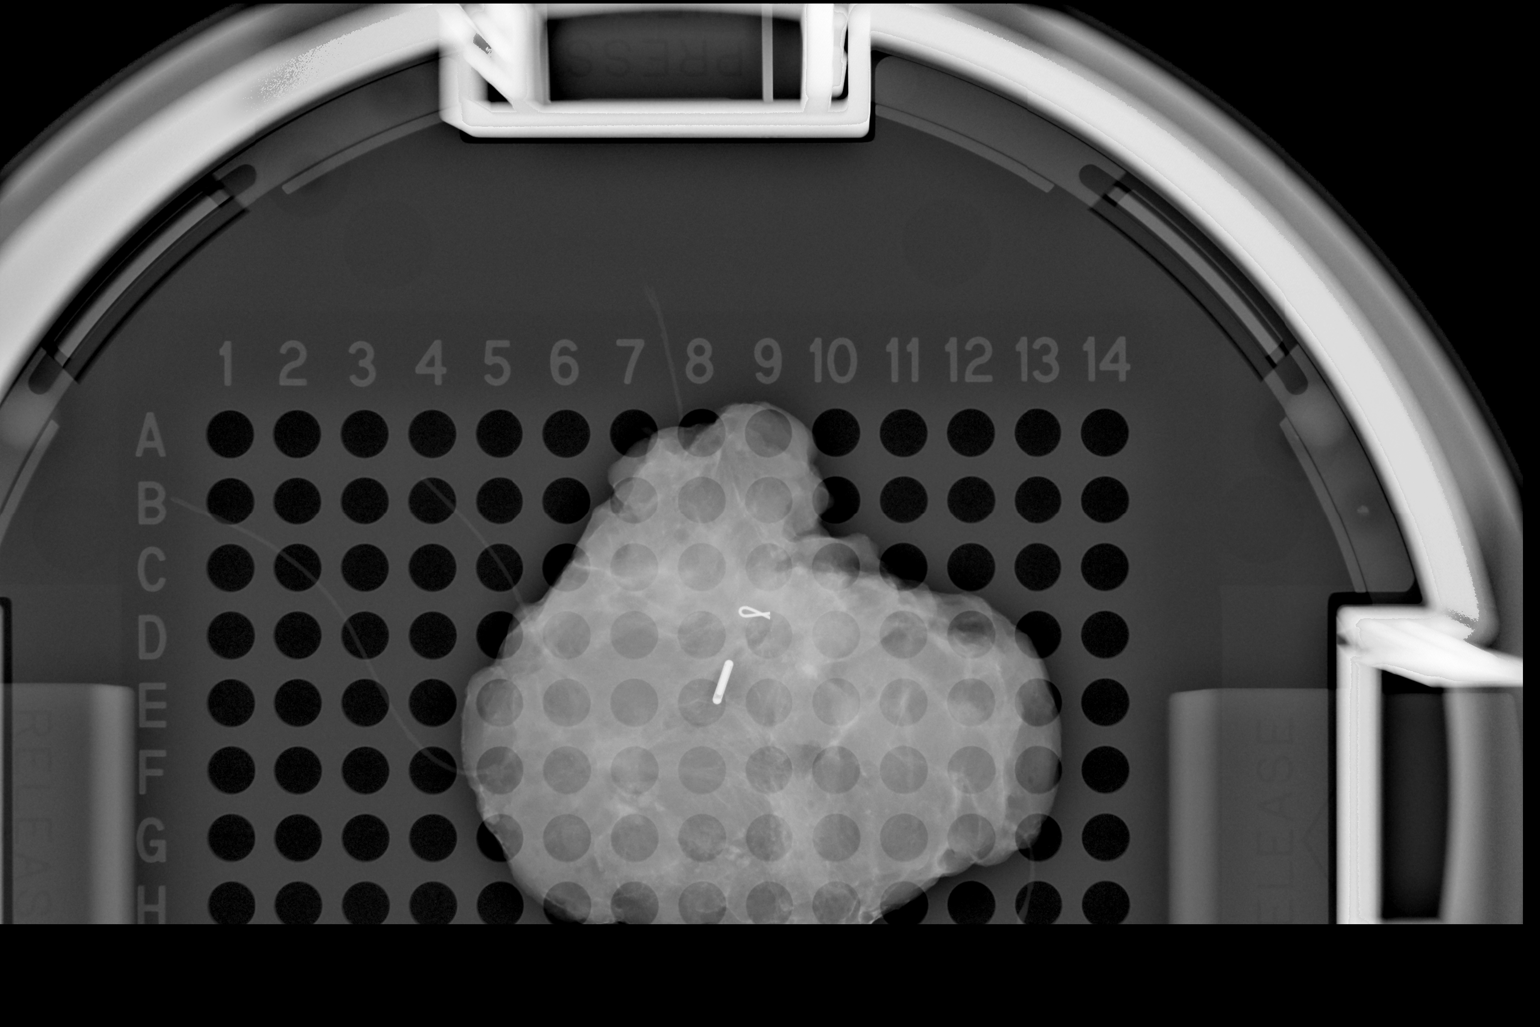

[1 of 1 positions shown; findings below may reference images not displayed]

FINDINGS: Status post excision of the left breast. The radioactive seed and
ribbon shaped biopsy marker clip are present, completely intact, and
were marked for pathology.
IMPRESSION: Specimen radiograph of the left breast.

## 2020-02-16 DIAGNOSIS — R809 Proteinuria, unspecified: Secondary | ICD-10-CM | POA: Diagnosis not present

## 2020-02-16 DIAGNOSIS — N183 Chronic kidney disease, stage 3 unspecified: Secondary | ICD-10-CM | POA: Diagnosis not present

## 2020-02-16 DIAGNOSIS — E876 Hypokalemia: Secondary | ICD-10-CM | POA: Diagnosis not present

## 2020-02-16 DIAGNOSIS — E2609 Other primary hyperaldosteronism: Secondary | ICD-10-CM | POA: Diagnosis not present

## 2020-02-16 DIAGNOSIS — N051 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: Secondary | ICD-10-CM | POA: Diagnosis not present

## 2020-02-16 DIAGNOSIS — I129 Hypertensive chronic kidney disease with stage 1 through stage 4 chronic kidney disease, or unspecified chronic kidney disease: Secondary | ICD-10-CM | POA: Diagnosis not present

## 2020-05-27 ENCOUNTER — Ambulatory Visit
Admission: EM | Admit: 2020-05-27 | Discharge: 2020-05-27 | Disposition: A | Discharge status: Home / Self Care | Payer: Medicaid Other | Attending: Emergency Medicine | Admitting: Emergency Medicine

## 2020-05-27 DIAGNOSIS — Z1152 Encounter for screening for COVID-19: Secondary | ICD-10-CM

## 2020-05-28 LAB — SARS-COV-2, NAA 2 DAY TAT

## 2020-05-28 LAB — NOVEL CORONAVIRUS, NAA: SARS-CoV-2, NAA: NOT DETECTED

## 2020-06-10 DIAGNOSIS — N183 Chronic kidney disease, stage 3 unspecified: Secondary | ICD-10-CM | POA: Diagnosis not present

## 2020-06-16 DIAGNOSIS — N184 Chronic kidney disease, stage 4 (severe): Secondary | ICD-10-CM | POA: Diagnosis not present

## 2020-06-16 DIAGNOSIS — R809 Proteinuria, unspecified: Secondary | ICD-10-CM | POA: Diagnosis not present

## 2020-06-16 DIAGNOSIS — N051 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: Secondary | ICD-10-CM | POA: Diagnosis not present

## 2020-06-16 DIAGNOSIS — I129 Hypertensive chronic kidney disease with stage 1 through stage 4 chronic kidney disease, or unspecified chronic kidney disease: Secondary | ICD-10-CM | POA: Diagnosis not present

## 2020-06-16 DIAGNOSIS — E876 Hypokalemia: Secondary | ICD-10-CM | POA: Diagnosis not present

## 2020-06-16 DIAGNOSIS — E2609 Other primary hyperaldosteronism: Secondary | ICD-10-CM | POA: Diagnosis not present

## 2020-07-11 DIAGNOSIS — E1165 Type 2 diabetes mellitus with hyperglycemia: Secondary | ICD-10-CM | POA: Diagnosis not present

## 2020-07-11 DIAGNOSIS — E785 Hyperlipidemia, unspecified: Secondary | ICD-10-CM | POA: Diagnosis not present

## 2020-07-11 DIAGNOSIS — R748 Abnormal levels of other serum enzymes: Secondary | ICD-10-CM | POA: Diagnosis not present

## 2020-07-11 DIAGNOSIS — J45909 Unspecified asthma, uncomplicated: Secondary | ICD-10-CM | POA: Diagnosis not present

## 2020-07-11 DIAGNOSIS — N1832 Chronic kidney disease, stage 3b: Secondary | ICD-10-CM | POA: Diagnosis not present

## 2020-07-11 DIAGNOSIS — E876 Hypokalemia: Secondary | ICD-10-CM | POA: Diagnosis not present

## 2020-07-11 DIAGNOSIS — D649 Anemia, unspecified: Secondary | ICD-10-CM | POA: Diagnosis not present

## 2020-07-11 DIAGNOSIS — I10 Essential (primary) hypertension: Secondary | ICD-10-CM | POA: Diagnosis not present

## 2020-07-15 DIAGNOSIS — N183 Chronic kidney disease, stage 3 unspecified: Secondary | ICD-10-CM | POA: Insufficient documentation

## 2020-07-15 DIAGNOSIS — E1100 Type 2 diabetes mellitus with hyperosmolarity without nonketotic hyperglycemic-hyperosmolar coma (NKHHC): Secondary | ICD-10-CM | POA: Insufficient documentation

## 2020-07-15 DIAGNOSIS — R748 Abnormal levels of other serum enzymes: Secondary | ICD-10-CM | POA: Insufficient documentation

## 2020-07-22 DIAGNOSIS — E118 Type 2 diabetes mellitus with unspecified complications: Secondary | ICD-10-CM | POA: Diagnosis not present

## 2020-07-22 DIAGNOSIS — N184 Chronic kidney disease, stage 4 (severe): Secondary | ICD-10-CM | POA: Diagnosis not present

## 2020-07-22 DIAGNOSIS — R748 Abnormal levels of other serum enzymes: Secondary | ICD-10-CM | POA: Diagnosis not present

## 2020-07-22 DIAGNOSIS — N051 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: Secondary | ICD-10-CM | POA: Diagnosis not present

## 2020-07-22 DIAGNOSIS — N2581 Secondary hyperparathyroidism of renal origin: Secondary | ICD-10-CM | POA: Diagnosis not present

## 2020-07-29 DIAGNOSIS — R748 Abnormal levels of other serum enzymes: Secondary | ICD-10-CM | POA: Diagnosis not present

## 2020-08-06 DIAGNOSIS — N2581 Secondary hyperparathyroidism of renal origin: Secondary | ICD-10-CM | POA: Insufficient documentation

## 2020-08-06 DIAGNOSIS — E119 Type 2 diabetes mellitus without complications: Secondary | ICD-10-CM | POA: Insufficient documentation

## 2020-08-06 DIAGNOSIS — N184 Chronic kidney disease, stage 4 (severe): Secondary | ICD-10-CM | POA: Insufficient documentation

## 2020-08-06 DIAGNOSIS — N051 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: Secondary | ICD-10-CM | POA: Insufficient documentation

## 2020-09-02 DIAGNOSIS — N051 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: Secondary | ICD-10-CM | POA: Diagnosis not present

## 2020-09-02 DIAGNOSIS — R748 Abnormal levels of other serum enzymes: Secondary | ICD-10-CM | POA: Diagnosis not present

## 2020-09-02 DIAGNOSIS — N184 Chronic kidney disease, stage 4 (severe): Secondary | ICD-10-CM | POA: Diagnosis not present

## 2020-09-02 DIAGNOSIS — N2581 Secondary hyperparathyroidism of renal origin: Secondary | ICD-10-CM | POA: Diagnosis not present

## 2020-09-02 DIAGNOSIS — E1165 Type 2 diabetes mellitus with hyperglycemia: Secondary | ICD-10-CM | POA: Diagnosis not present

## 2020-09-02 DIAGNOSIS — I10 Essential (primary) hypertension: Secondary | ICD-10-CM | POA: Diagnosis not present

## 2020-10-14 DIAGNOSIS — R748 Abnormal levels of other serum enzymes: Secondary | ICD-10-CM | POA: Diagnosis not present

## 2020-10-24 DIAGNOSIS — E875 Hyperkalemia: Secondary | ICD-10-CM | POA: Diagnosis not present

## 2020-10-24 DIAGNOSIS — R809 Proteinuria, unspecified: Secondary | ICD-10-CM | POA: Diagnosis not present

## 2020-10-24 DIAGNOSIS — I129 Hypertensive chronic kidney disease with stage 1 through stage 4 chronic kidney disease, or unspecified chronic kidney disease: Secondary | ICD-10-CM | POA: Diagnosis not present

## 2020-10-24 DIAGNOSIS — E2609 Other primary hyperaldosteronism: Secondary | ICD-10-CM | POA: Diagnosis not present

## 2020-10-24 DIAGNOSIS — N051 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: Secondary | ICD-10-CM | POA: Diagnosis not present

## 2020-10-24 DIAGNOSIS — N184 Chronic kidney disease, stage 4 (severe): Secondary | ICD-10-CM | POA: Diagnosis not present

## 2020-10-24 DIAGNOSIS — E872 Acidosis: Secondary | ICD-10-CM | POA: Diagnosis not present

## 2020-11-14 ENCOUNTER — Telehealth: Payer: Self-pay | Admitting: Family Medicine

## 2020-11-14 NOTE — Telephone Encounter (Signed)
I attempted to reach Renee Alexander today to get her scheduled for a phone visit with the [employer] team.I left my contact info on her VM.

## 2020-11-15 DIAGNOSIS — N184 Chronic kidney disease, stage 4 (severe): Secondary | ICD-10-CM | POA: Diagnosis not present

## 2020-12-06 ENCOUNTER — Telehealth: Payer: Self-pay | Admitting: Family Medicine

## 2020-12-06 NOTE — Telephone Encounter (Signed)
2nd attempt to reach Renee Alexander to get her scheduled for a phone visit with the [employer] team. I left my name and number on her VM for her to return my call.

## 2020-12-07 DIAGNOSIS — I129 Hypertensive chronic kidney disease with stage 1 through stage 4 chronic kidney disease, or unspecified chronic kidney disease: Secondary | ICD-10-CM | POA: Diagnosis not present

## 2020-12-07 DIAGNOSIS — N186 End stage renal disease: Secondary | ICD-10-CM | POA: Diagnosis not present

## 2020-12-07 DIAGNOSIS — Z01818 Encounter for other preprocedural examination: Secondary | ICD-10-CM | POA: Insufficient documentation

## 2020-12-07 DIAGNOSIS — N184 Chronic kidney disease, stage 4 (severe): Secondary | ICD-10-CM | POA: Diagnosis not present

## 2020-12-07 DIAGNOSIS — E1122 Type 2 diabetes mellitus with diabetic chronic kidney disease: Secondary | ICD-10-CM | POA: Diagnosis not present

## 2020-12-07 DIAGNOSIS — K219 Gastro-esophageal reflux disease without esophagitis: Secondary | ICD-10-CM | POA: Diagnosis not present

## 2020-12-07 DIAGNOSIS — E2609 Other primary hyperaldosteronism: Secondary | ICD-10-CM | POA: Diagnosis not present

## 2020-12-07 DIAGNOSIS — E785 Hyperlipidemia, unspecified: Secondary | ICD-10-CM | POA: Diagnosis not present

## 2020-12-15 DIAGNOSIS — N051 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: Secondary | ICD-10-CM | POA: Diagnosis not present

## 2020-12-15 DIAGNOSIS — I129 Hypertensive chronic kidney disease with stage 1 through stage 4 chronic kidney disease, or unspecified chronic kidney disease: Secondary | ICD-10-CM | POA: Diagnosis not present

## 2020-12-15 DIAGNOSIS — E872 Acidosis: Secondary | ICD-10-CM | POA: Diagnosis not present

## 2020-12-15 DIAGNOSIS — E875 Hyperkalemia: Secondary | ICD-10-CM | POA: Diagnosis not present

## 2020-12-15 DIAGNOSIS — N184 Chronic kidney disease, stage 4 (severe): Secondary | ICD-10-CM | POA: Diagnosis not present

## 2020-12-15 DIAGNOSIS — R809 Proteinuria, unspecified: Secondary | ICD-10-CM | POA: Diagnosis not present

## 2020-12-15 DIAGNOSIS — E2609 Other primary hyperaldosteronism: Secondary | ICD-10-CM | POA: Diagnosis not present

## 2020-12-20 ENCOUNTER — Other Ambulatory Visit: Payer: Self-pay | Admitting: Nephrology

## 2020-12-20 DIAGNOSIS — Z1231 Encounter for screening mammogram for malignant neoplasm of breast: Secondary | ICD-10-CM

## 2020-12-26 ENCOUNTER — Other Ambulatory Visit: Payer: Self-pay | Admitting: Urgent Care

## 2020-12-26 DIAGNOSIS — Z1231 Encounter for screening mammogram for malignant neoplasm of breast: Secondary | ICD-10-CM

## 2021-01-02 DIAGNOSIS — J4520 Mild intermittent asthma, uncomplicated: Secondary | ICD-10-CM | POA: Diagnosis not present

## 2021-01-02 DIAGNOSIS — K219 Gastro-esophageal reflux disease without esophagitis: Secondary | ICD-10-CM | POA: Diagnosis not present

## 2021-01-02 DIAGNOSIS — Z1211 Encounter for screening for malignant neoplasm of colon: Secondary | ICD-10-CM | POA: Diagnosis not present

## 2021-01-02 DIAGNOSIS — E118 Type 2 diabetes mellitus with unspecified complications: Secondary | ICD-10-CM | POA: Diagnosis not present

## 2021-01-17 ENCOUNTER — Ambulatory Visit
Admission: RE | Admit: 2021-01-17 | Discharge: 2021-01-17 | Disposition: A | Discharge status: Home / Self Care | Payer: Medicaid Other | Source: Ambulatory Visit | Attending: Urgent Care | Admitting: Urgent Care

## 2021-01-17 ENCOUNTER — Other Ambulatory Visit: Payer: Self-pay

## 2021-01-17 DIAGNOSIS — Z1231 Encounter for screening mammogram for malignant neoplasm of breast: Secondary | ICD-10-CM

## 2021-01-25 DIAGNOSIS — E1122 Type 2 diabetes mellitus with diabetic chronic kidney disease: Secondary | ICD-10-CM | POA: Diagnosis not present

## 2021-01-25 DIAGNOSIS — Z01810 Encounter for preprocedural cardiovascular examination: Secondary | ICD-10-CM | POA: Diagnosis not present

## 2021-01-25 DIAGNOSIS — Z7682 Awaiting organ transplant status: Secondary | ICD-10-CM | POA: Diagnosis not present

## 2021-01-25 DIAGNOSIS — I129 Hypertensive chronic kidney disease with stage 1 through stage 4 chronic kidney disease, or unspecified chronic kidney disease: Secondary | ICD-10-CM | POA: Diagnosis not present

## 2021-01-25 DIAGNOSIS — I130 Hypertensive heart and chronic kidney disease with heart failure and stage 1 through stage 4 chronic kidney disease, or unspecified chronic kidney disease: Secondary | ICD-10-CM | POA: Diagnosis not present

## 2021-01-25 DIAGNOSIS — I517 Cardiomegaly: Secondary | ICD-10-CM | POA: Diagnosis not present

## 2021-01-25 DIAGNOSIS — N184 Chronic kidney disease, stage 4 (severe): Secondary | ICD-10-CM | POA: Diagnosis not present

## 2021-02-08 DIAGNOSIS — N184 Chronic kidney disease, stage 4 (severe): Secondary | ICD-10-CM | POA: Diagnosis not present

## 2021-02-13 ENCOUNTER — Encounter: Payer: Self-pay | Admitting: Gastroenterology

## 2021-02-14 DIAGNOSIS — N184 Chronic kidney disease, stage 4 (severe): Secondary | ICD-10-CM | POA: Diagnosis not present

## 2021-02-14 DIAGNOSIS — N051 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: Secondary | ICD-10-CM | POA: Diagnosis not present

## 2021-02-14 DIAGNOSIS — R809 Proteinuria, unspecified: Secondary | ICD-10-CM | POA: Diagnosis not present

## 2021-02-14 DIAGNOSIS — E2609 Other primary hyperaldosteronism: Secondary | ICD-10-CM | POA: Diagnosis not present

## 2021-02-14 DIAGNOSIS — E875 Hyperkalemia: Secondary | ICD-10-CM | POA: Diagnosis not present

## 2021-02-14 DIAGNOSIS — I129 Hypertensive chronic kidney disease with stage 1 through stage 4 chronic kidney disease, or unspecified chronic kidney disease: Secondary | ICD-10-CM | POA: Diagnosis not present

## 2021-02-14 DIAGNOSIS — E876 Hypokalemia: Secondary | ICD-10-CM | POA: Diagnosis not present

## 2021-02-17 ENCOUNTER — Ambulatory Visit: Payer: Medicaid Other

## 2021-03-08 ENCOUNTER — Other Ambulatory Visit: Payer: Self-pay

## 2021-03-08 ENCOUNTER — Ambulatory Visit (AMBULATORY_SURGERY_CENTER): Payer: Medicaid Other

## 2021-03-08 VITALS — Ht 64.5 in | Wt 229.0 lb

## 2021-03-08 DIAGNOSIS — Z1211 Encounter for screening for malignant neoplasm of colon: Secondary | ICD-10-CM

## 2021-03-08 MED ORDER — NA SULFATE-K SULFATE-MG SULF 17.5-3.13-1.6 GM/177ML PO SOLN: 1.0000 | Freq: Once | ORAL | 0 refills | Status: AC

## 2021-03-08 NOTE — Progress Notes (Signed)
Patient's pre-visit was done today over the phone with the patient   Name,DOB and address verified.   Patient denies any allergies to Eggs and Soy. Patient denies any problems with anesthesia/sedation. Patient denies taking diet pills or blood thinners. No home Oxygen. Packet of Prep instructions mailed to patient including a copy of a consent form-pt is aware. Patient understands to call us back with any questions or concerns. Patient is aware of our care-partner policy and [id number] safety protocol.   Not taking Metformin

## 2021-03-21 ENCOUNTER — Encounter: Payer: Self-pay | Admitting: Certified Registered Nurse Anesthetist

## 2021-03-22 ENCOUNTER — Ambulatory Visit (AMBULATORY_SURGERY_CENTER): Payer: Medicaid Other | Admitting: Gastroenterology

## 2021-03-22 ENCOUNTER — Other Ambulatory Visit: Payer: Self-pay

## 2021-03-22 ENCOUNTER — Encounter: Payer: Self-pay | Admitting: Gastroenterology

## 2021-03-22 VITALS — BP 140/85 | HR 82 | Temp 97.1°F | Resp 16 | Ht 64.0 in | Wt 229.0 lb

## 2021-03-22 DIAGNOSIS — D123 Benign neoplasm of transverse colon: Secondary | ICD-10-CM | POA: Diagnosis not present

## 2021-03-22 DIAGNOSIS — I10 Essential (primary) hypertension: Secondary | ICD-10-CM | POA: Diagnosis not present

## 2021-03-22 DIAGNOSIS — E119 Type 2 diabetes mellitus without complications: Secondary | ICD-10-CM | POA: Diagnosis not present

## 2021-03-22 DIAGNOSIS — Z1211 Encounter for screening for malignant neoplasm of colon: Secondary | ICD-10-CM

## 2021-03-22 MED ORDER — SODIUM CHLORIDE 0.9 % IV SOLN: 500.0000 mL | Freq: Once | INTRAVENOUS | Status: DC

## 2021-03-22 NOTE — Progress Notes (Signed)
Called to room to assist during endoscopic procedure.  Patient ID and intended procedure confirmed with present staff. Received instructions for my participation in the procedure from the performing physician.  

## 2021-03-22 NOTE — Progress Notes (Signed)
0942 BP  167/105, Labetalol given IV, MD update, vss

## 2021-03-22 NOTE — Patient Instructions (Signed)
Thank you for allowing [place] to care for you today!  Await final results of polyps removed.  Will make recommendation at that time for next colonoscopy 5-10 years.  Resume previous diet and medications today, Return to normal daily activities tomorrow, 03/23/21.   YOU HAD AN ENDOSCOPIC PROCEDURE TODAY AT [facility]:   Refer to the procedure report that was given to you for any specific questions about what was found during the examination.  If the procedure report does not answer your questions, please call your gastroenterologist to clarify.  If you requested that your care partner not be given the details of your procedure findings, then the procedure report has been included in a sealed envelope for you to review at your convenience later.  YOU SHOULD EXPECT: Some feelings of bloating in the abdomen. Passage of more gas than usual.  Walking can help get rid of the air that was put into your GI tract during the procedure and reduce the bloating. If you had a lower endoscopy (such as a colonoscopy or flexible sigmoidoscopy) you may notice spotting of blood in your stool or on the toilet paper. If you underwent a bowel prep for your procedure, you may not have a normal bowel movement for a few days.  Please Note:  You might notice some irritation and congestion in your nose or some drainage.  This is from the oxygen used during your procedure.  There is no need for concern and it should clear up in a day or so.  SYMPTOMS TO REPORT IMMEDIATELY:  Following lower endoscopy (colonoscopy or flexible sigmoidoscopy):  Excessive amounts of blood in the stool  Significant tenderness or worsening of abdominal pains  Swelling of the abdomen that is new, acute  Fever of 100F or higher   For urgent or emergent issues, a gastroenterologist can be reached at any hour by calling [phone]. Do not use MyChart messaging for urgent concerns.    DIET:  We do recommend a small meal at first,  but then you may proceed to your regular diet.  Drink plenty of fluids but you should avoid alcoholic beverages for 24 hours.  ACTIVITY:  You should plan to take it easy for the rest of today and you should NOT DRIVE or use heavy machinery until tomorrow (because of the sedation medicines used during the test).    FOLLOW UP: Our staff will call the number listed on your records 48-72 hours following your procedure to check on you and address any questions or concerns that you may have regarding the information given to you following your procedure. If we do not reach you, we will leave a message.  We will attempt to reach you two times.  During this call, we will ask if you have developed any symptoms of COVID 19. If you develop any symptoms (ie: fever, flu-like symptoms, shortness of breath, cough etc.) before then, please call [phone].  If you test positive for Covid 19 in the 2 weeks post procedure, please call and report this information to [place].    If any biopsies were taken you will be contacted by phone or by letter within the next 1-3 weeks.  Please call us at [phone] if you have not heard about the biopsies in 3 weeks.    SIGNATURES/CONFIDENTIALITY: You and/or your care partner have signed paperwork which will be entered into your electronic medical record.  These signatures attest to the fact that that the information above on  your After Visit Summary has been reviewed and is understood.  Full responsibility of the confidentiality of this discharge information lies with you and/or your care-partner.

## 2021-03-22 NOTE — Progress Notes (Signed)
Pt's states no medical or surgical changes since previsit or office visit. VS by CW. 

## 2021-03-22 NOTE — Progress Notes (Signed)
Report given to PACU, vss 

## 2021-03-22 NOTE — Op Note (Signed)
[facility] Patient Name: Renee Alexander Procedure Date: 03/22/2021 9:35 AM MRN: [id number] Endoscopist: Mauri Pole , MD Age: 50 Referring MD:  Date of Birth: [birth year] Gender: Female Account #: [id number] Procedure:                Colonoscopy Indications:              Screening for colorectal malignant neoplasm Medicines:                Monitored Anesthesia Care Procedure:                Pre-Anesthesia Assessment:                           - Prior to the procedure, a History and Physical                            was performed, and patient medications and                            allergies were reviewed. The patient's tolerance of                            previous anesthesia was also reviewed. The risks                            and benefits of the procedure and the sedation                            options and risks were discussed with the patient.                            All questions were answered, and informed consent                            was obtained. Prior Anticoagulants: The patient has                            taken no previous anticoagulant or antiplatelet                            agents. ASA Grade Assessment: II - A patient with                            mild systemic disease. After reviewing the risks                            and benefits, the patient was deemed in                            satisfactory condition to undergo the procedure.                           After obtaining informed consent, the colonoscope  was passed under direct vision. Throughout the                            procedure, the patient's blood pressure, pulse, and                            oxygen saturations were monitored continuously. The                            Olympus [id number]-[id number]) Colonoscope was                            introduced through the anus and advanced to the the                            cecum,  identified by appendiceal orifice and                            ileocecal valve. The colonoscopy was performed                            without difficulty. The patient tolerated the                            procedure well. The quality of the bowel                            preparation was excellent. The ileocecal valve,                            appendiceal orifice, and rectum were photographed. Scope In: 9:46:16 AM Scope Out: 10:01:04 AM Scope Withdrawal Time: 0 hours 9 minutes 0 seconds  Total Procedure Duration: 0 hours 14 minutes 48 seconds  Findings:                 The perianal and digital rectal examinations were                            normal.                           A 5 mm polyp was found in the transverse colon. The                            polyp was sessile. The polyp was removed with a                            cold snare. Resection and retrieval were complete.                           Scattered small-mouthed diverticula were found in                            the sigmoid colon, descending colon and ascending  colon.                           Non-bleeding external and internal hemorrhoids were                            found during retroflexion. The hemorrhoids were                            medium-sized. Complications:            No immediate complications. Estimated Blood Loss:     Estimated blood loss was minimal. Impression:               - One 5 mm polyp in the transverse colon, removed                            with a cold snare. Resected and retrieved.                           - Diverticulosis in the sigmoid colon, in the                            descending colon and in the ascending colon.                           - Non-bleeding external and internal hemorrhoids. Recommendation:           - Patient has a contact number available for                            emergencies. The signs and symptoms of potential                             delayed complications were discussed with the                            patient. Return to normal activities tomorrow.                            Written discharge instructions were provided to the                            patient.                           - Resume previous diet.                           - Continue present medications.                           - Await pathology results.                           - Repeat colonoscopy in 5-10 years for surveillance  based on pathology results. Mauri Pole, MD 03/22/2021 10:09:43 AM This report has been signed electronically.

## 2021-03-24 ENCOUNTER — Telehealth: Payer: Self-pay

## 2021-03-24 NOTE — Telephone Encounter (Signed)
  Follow up Call-  Call back number 03/22/2021  Post procedure Call Back phone  # [phone]  Permission to leave phone message Yes  Some recent data might be hidden     Patient questions:  Do you have a fever, pain , or abdominal swelling? No. Pain Score  0 *  Have you tolerated food without any problems? Yes.    Have you been able to return to your normal activities? Yes.    Do you have any questions about your discharge instructions: Diet   No. Medications  No. Follow up visit  No.  Do you have questions or concerns about your Care? No.  Actions: * If pain score is 4 or above: No action needed, pain <4.  Have you developed a fever since your procedure? no  2.   Have you had an respiratory symptoms (SOB or cough) since your procedure? no  3.   Have you tested positive for COVID 19 since your procedure no  4.   Have you had any family members/close contacts diagnosed with the COVID 19 since your procedure?  no   If yes to any of these questions please route to Joylene John, RN and Joella Prince, RN

## 2021-04-11 ENCOUNTER — Encounter: Payer: Self-pay | Admitting: Gastroenterology

## 2021-05-08 DIAGNOSIS — Z23 Encounter for immunization: Secondary | ICD-10-CM | POA: Diagnosis not present

## 2021-05-08 DIAGNOSIS — E118 Type 2 diabetes mellitus with unspecified complications: Secondary | ICD-10-CM | POA: Diagnosis not present

## 2021-05-08 DIAGNOSIS — K219 Gastro-esophageal reflux disease without esophagitis: Secondary | ICD-10-CM | POA: Diagnosis not present

## 2021-05-08 DIAGNOSIS — J4520 Mild intermittent asthma, uncomplicated: Secondary | ICD-10-CM | POA: Diagnosis not present

## 2021-05-08 DIAGNOSIS — Z79899 Other long term (current) drug therapy: Secondary | ICD-10-CM | POA: Diagnosis not present

## 2021-05-09 DIAGNOSIS — N184 Chronic kidney disease, stage 4 (severe): Secondary | ICD-10-CM | POA: Diagnosis not present

## 2021-05-18 DIAGNOSIS — N051 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: Secondary | ICD-10-CM | POA: Diagnosis not present

## 2021-05-18 DIAGNOSIS — E876 Hypokalemia: Secondary | ICD-10-CM | POA: Diagnosis not present

## 2021-05-18 DIAGNOSIS — E2609 Other primary hyperaldosteronism: Secondary | ICD-10-CM | POA: Diagnosis not present

## 2021-05-18 DIAGNOSIS — N184 Chronic kidney disease, stage 4 (severe): Secondary | ICD-10-CM | POA: Diagnosis not present

## 2021-05-18 DIAGNOSIS — I129 Hypertensive chronic kidney disease with stage 1 through stage 4 chronic kidney disease, or unspecified chronic kidney disease: Secondary | ICD-10-CM | POA: Diagnosis not present

## 2021-05-18 DIAGNOSIS — R809 Proteinuria, unspecified: Secondary | ICD-10-CM | POA: Diagnosis not present

## 2021-05-18 DIAGNOSIS — E875 Hyperkalemia: Secondary | ICD-10-CM | POA: Diagnosis not present

## 2021-07-28 DIAGNOSIS — N184 Chronic kidney disease, stage 4 (severe): Secondary | ICD-10-CM | POA: Diagnosis not present

## 2021-08-03 DIAGNOSIS — E876 Hypokalemia: Secondary | ICD-10-CM | POA: Diagnosis not present

## 2021-08-03 DIAGNOSIS — E875 Hyperkalemia: Secondary | ICD-10-CM | POA: Diagnosis not present

## 2021-08-03 DIAGNOSIS — E2609 Other primary hyperaldosteronism: Secondary | ICD-10-CM | POA: Diagnosis not present

## 2021-08-03 DIAGNOSIS — N184 Chronic kidney disease, stage 4 (severe): Secondary | ICD-10-CM | POA: Diagnosis not present

## 2021-08-03 DIAGNOSIS — N051 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: Secondary | ICD-10-CM | POA: Diagnosis not present

## 2021-08-03 DIAGNOSIS — I129 Hypertensive chronic kidney disease with stage 1 through stage 4 chronic kidney disease, or unspecified chronic kidney disease: Secondary | ICD-10-CM | POA: Diagnosis not present

## 2021-08-03 DIAGNOSIS — R809 Proteinuria, unspecified: Secondary | ICD-10-CM | POA: Diagnosis not present

## 2021-08-07 DIAGNOSIS — E119 Type 2 diabetes mellitus without complications: Secondary | ICD-10-CM | POA: Diagnosis not present

## 2021-08-07 DIAGNOSIS — M25511 Pain in right shoulder: Secondary | ICD-10-CM | POA: Diagnosis not present

## 2021-08-07 DIAGNOSIS — I10 Essential (primary) hypertension: Secondary | ICD-10-CM | POA: Diagnosis not present

## 2021-08-07 DIAGNOSIS — N184 Chronic kidney disease, stage 4 (severe): Secondary | ICD-10-CM | POA: Diagnosis not present

## 2021-08-07 DIAGNOSIS — M7501 Adhesive capsulitis of right shoulder: Secondary | ICD-10-CM | POA: Diagnosis not present

## 2021-08-07 DIAGNOSIS — Z79899 Other long term (current) drug therapy: Secondary | ICD-10-CM | POA: Diagnosis not present

## 2021-11-06 DIAGNOSIS — I10 Essential (primary) hypertension: Secondary | ICD-10-CM | POA: Diagnosis not present

## 2021-11-06 DIAGNOSIS — Z23 Encounter for immunization: Secondary | ICD-10-CM | POA: Diagnosis not present

## 2021-11-06 DIAGNOSIS — E119 Type 2 diabetes mellitus without complications: Secondary | ICD-10-CM | POA: Diagnosis not present

## 2021-12-26 DIAGNOSIS — E876 Hypokalemia: Secondary | ICD-10-CM | POA: Diagnosis not present

## 2021-12-26 DIAGNOSIS — R809 Proteinuria, unspecified: Secondary | ICD-10-CM | POA: Diagnosis not present

## 2021-12-26 DIAGNOSIS — I129 Hypertensive chronic kidney disease with stage 1 through stage 4 chronic kidney disease, or unspecified chronic kidney disease: Secondary | ICD-10-CM | POA: Diagnosis not present

## 2021-12-26 DIAGNOSIS — N051 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: Secondary | ICD-10-CM | POA: Diagnosis not present

## 2021-12-26 DIAGNOSIS — N184 Chronic kidney disease, stage 4 (severe): Secondary | ICD-10-CM | POA: Diagnosis not present

## 2021-12-26 DIAGNOSIS — E875 Hyperkalemia: Secondary | ICD-10-CM | POA: Diagnosis not present

## 2021-12-26 DIAGNOSIS — E2609 Other primary hyperaldosteronism: Secondary | ICD-10-CM | POA: Diagnosis not present

## 2022-06-25 ENCOUNTER — Encounter (HOSPITAL_COMMUNITY)
Admission: RE | Admit: 2022-06-25 | Discharge: 2022-06-25 | Disposition: A | Discharge status: Home / Self Care | Payer: Medicaid Other | Source: Ambulatory Visit | Attending: Nephrology | Admitting: Nephrology

## 2022-06-25 VITALS — BP 179/98 | HR 95 | Temp 97.4°F | Resp 17

## 2022-06-25 DIAGNOSIS — D631 Anemia in chronic kidney disease: Secondary | ICD-10-CM | POA: Diagnosis present

## 2022-06-25 DIAGNOSIS — N185 Chronic kidney disease, stage 5: Secondary | ICD-10-CM | POA: Diagnosis present

## 2022-06-25 LAB — RENAL FUNCTION PANEL
Albumin: 3.9 g/dL (ref 3.5–5.0)
Anion gap: 10 (ref 5–15)
BUN: 35 mg/dL — ABNORMAL HIGH (ref 6–20)
CO2: 21 mmol/L — ABNORMAL LOW (ref 22–32)
Calcium: 10.1 mg/dL (ref 8.9–10.3)
Chloride: 111 mmol/L (ref 98–111)
Creatinine, Ser: 3.85 mg/dL — ABNORMAL HIGH (ref 0.44–1.00)
GFR, Estimated: 14 mL/min — ABNORMAL LOW (ref >60)
Glucose, Bld: 133 mg/dL — ABNORMAL HIGH (ref 70–99)
Phosphorus: 3.6 mg/dL (ref 2.5–4.6)
Potassium: 4.2 mmol/L (ref 3.5–5.1)
Sodium: 142 mmol/L (ref 135–145)

## 2022-06-25 LAB — POCT HEMOGLOBIN-HEMACUE: Hemoglobin: 10.8 g/dL — ABNORMAL LOW (ref 12.0–15.0)

## 2022-06-25 LAB — IRON AND TIBC
Iron: 60 ug/dL (ref 28–170)
Saturation Ratios: 18 % (ref 10.4–31.8)
TIBC: 337 ug/dL (ref 250–450)
UIBC: 277 ug/dL

## 2022-06-25 LAB — FERRITIN: Ferritin: 35 ng/mL (ref 11–307)

## 2022-06-25 MED ORDER — EPOETIN ALFA-EPBX 10000 UNIT/ML IJ SOLN
20000.0000 [IU] | INTRAMUSCULAR | Status: DC
  Administered 2022-06-25: 20000 [IU] via SUBCUTANEOUS

## 2022-06-25 MED ORDER — EPOETIN ALFA-EPBX 10000 UNIT/ML IJ SOLN
INTRAMUSCULAR | Status: AC
  Filled 2022-06-25: qty 2

## 2022-06-25 MED ORDER — CLONIDINE HCL 0.1 MG PO TABS: 0.1000 mg | ORAL_TABLET | Freq: Once | ORAL | Status: DC | PRN

## 2022-06-26 LAB — PTH, INTACT AND CALCIUM
Calcium, Total (PTH): 10.1 mg/dL (ref 8.7–10.2)
PTH: 332 pg/mL — ABNORMAL HIGH (ref 15–65)

## 2022-07-23 ENCOUNTER — Ambulatory Visit (HOSPITAL_COMMUNITY)
Admission: RE | Admit: 2022-07-23 | Discharge: 2022-07-23 | Disposition: A | Discharge status: Home / Self Care | Payer: Medicaid Other | Source: Ambulatory Visit | Attending: Nephrology | Admitting: Nephrology

## 2022-07-23 VITALS — BP 170/98 | HR 82 | Temp 97.3°F | Resp 17

## 2022-07-23 DIAGNOSIS — N185 Chronic kidney disease, stage 5: Secondary | ICD-10-CM | POA: Insufficient documentation

## 2022-07-23 DIAGNOSIS — D631 Anemia in chronic kidney disease: Secondary | ICD-10-CM | POA: Insufficient documentation

## 2022-07-23 LAB — RENAL FUNCTION PANEL
Albumin: 3.9 g/dL (ref 3.5–5.0)
Anion gap: 3 — ABNORMAL LOW (ref 5–15)
BUN: 39 mg/dL — ABNORMAL HIGH (ref 6–20)
CO2: 21 mmol/L — ABNORMAL LOW (ref 22–32)
Calcium: 9.4 mg/dL (ref 8.9–10.3)
Chloride: 117 mmol/L — ABNORMAL HIGH (ref 98–111)
Creatinine, Ser: 4.16 mg/dL — ABNORMAL HIGH (ref 0.44–1.00)
GFR, Estimated: 12 mL/min — ABNORMAL LOW (ref >60)
Glucose, Bld: 167 mg/dL — ABNORMAL HIGH (ref 70–99)
Phosphorus: 3.9 mg/dL (ref 2.5–4.6)
Potassium: 4.2 mmol/L (ref 3.5–5.1)
Sodium: 141 mmol/L (ref 135–145)

## 2022-07-23 LAB — IRON AND TIBC
Iron: 47 ug/dL (ref 28–170)
Saturation Ratios: 14 % (ref 10.4–31.8)
TIBC: 349 ug/dL (ref 250–450)
UIBC: 302 ug/dL

## 2022-07-23 LAB — POCT HEMOGLOBIN-HEMACUE: Hemoglobin: 10 g/dL — ABNORMAL LOW (ref 12.0–15.0)

## 2022-07-23 MED ORDER — EPOETIN ALFA-EPBX 10000 UNIT/ML IJ SOLN: 20000.0000 [IU] | INTRAMUSCULAR | Status: DC

## 2022-07-23 MED ORDER — EPOETIN ALFA-EPBX 10000 UNIT/ML IJ SOLN
INTRAMUSCULAR | Status: AC
  Administered 2022-07-23: 20000 [IU] via SUBCUTANEOUS
  Filled 2022-07-23: qty 2

## 2022-08-21 ENCOUNTER — Encounter (HOSPITAL_COMMUNITY)
Admission: RE | Admit: 2022-08-21 | Discharge: 2022-08-21 | Disposition: A | Discharge status: Home / Self Care | Payer: Medicaid Other | Source: Ambulatory Visit | Attending: Nephrology | Admitting: Nephrology

## 2022-08-21 VITALS — BP 175/89 | HR 100 | Temp 97.1°F | Resp 18

## 2022-08-21 DIAGNOSIS — N185 Chronic kidney disease, stage 5: Secondary | ICD-10-CM | POA: Insufficient documentation

## 2022-08-21 DIAGNOSIS — D631 Anemia in chronic kidney disease: Secondary | ICD-10-CM | POA: Insufficient documentation

## 2022-08-21 LAB — RENAL FUNCTION PANEL
Albumin: 3.8 g/dL (ref 3.5–5.0)
Anion gap: 8 (ref 5–15)
BUN: 32 mg/dL — ABNORMAL HIGH (ref 6–20)
CO2: 20 mmol/L — ABNORMAL LOW (ref 22–32)
Calcium: 9.8 mg/dL (ref 8.9–10.3)
Chloride: 110 mmol/L (ref 98–111)
Creatinine, Ser: 3.85 mg/dL — ABNORMAL HIGH (ref 0.44–1.00)
GFR, Estimated: 14 mL/min — ABNORMAL LOW (ref >60)
Glucose, Bld: 153 mg/dL — ABNORMAL HIGH (ref 70–99)
Phosphorus: 4.1 mg/dL (ref 2.5–4.6)
Potassium: 4.1 mmol/L (ref 3.5–5.1)
Sodium: 138 mmol/L (ref 135–145)

## 2022-08-21 LAB — IRON AND TIBC
Iron: 71 ug/dL (ref 28–170)
Saturation Ratios: 21 % (ref 10.4–31.8)
TIBC: 335 ug/dL (ref 250–450)
UIBC: 264 ug/dL

## 2022-08-21 LAB — POCT HEMOGLOBIN-HEMACUE: Hemoglobin: 10.4 g/dL — ABNORMAL LOW (ref 12.0–15.0)

## 2022-08-21 MED ORDER — EPOETIN ALFA-EPBX 10000 UNIT/ML IJ SOLN
INTRAMUSCULAR | Status: AC
  Filled 2022-08-21: qty 2

## 2022-08-21 MED ORDER — EPOETIN ALFA-EPBX 10000 UNIT/ML IJ SOLN
20000.0000 [IU] | INTRAMUSCULAR | Status: DC
  Administered 2022-08-21: 20000 [IU] via SUBCUTANEOUS

## 2022-09-06 DIAGNOSIS — N186 End stage renal disease: Secondary | ICD-10-CM | POA: Insufficient documentation

## 2022-09-06 DIAGNOSIS — E119 Type 2 diabetes mellitus without complications: Secondary | ICD-10-CM | POA: Insufficient documentation

## 2022-09-06 DIAGNOSIS — E1122 Type 2 diabetes mellitus with diabetic chronic kidney disease: Secondary | ICD-10-CM | POA: Insufficient documentation

## 2022-09-18 ENCOUNTER — Ambulatory Visit (HOSPITAL_COMMUNITY)
Admission: RE | Admit: 2022-09-18 | Discharge: 2022-09-18 | Disposition: A | Discharge status: Home / Self Care | Payer: Medicaid Other | Source: Ambulatory Visit | Attending: Nephrology | Admitting: Nephrology

## 2022-09-18 VITALS — BP 142/83 | HR 104 | Temp 97.1°F | Resp 17

## 2022-09-18 DIAGNOSIS — D631 Anemia in chronic kidney disease: Secondary | ICD-10-CM | POA: Insufficient documentation

## 2022-09-18 DIAGNOSIS — N185 Chronic kidney disease, stage 5: Secondary | ICD-10-CM | POA: Diagnosis present

## 2022-09-18 LAB — POCT HEMOGLOBIN-HEMACUE: Hemoglobin: 10.4 g/dL — ABNORMAL LOW (ref 12.0–15.0)

## 2022-09-18 MED ORDER — EPOETIN ALFA-EPBX 10000 UNIT/ML IJ SOLN: 20000.0000 [IU] | INTRAMUSCULAR | Status: DC

## 2022-09-18 MED ORDER — EPOETIN ALFA-EPBX 10000 UNIT/ML IJ SOLN
INTRAMUSCULAR | Status: AC
  Administered 2022-09-18: 20000 [IU] via SUBCUTANEOUS
  Filled 2022-09-18: qty 2

## 2022-10-16 ENCOUNTER — Encounter (HOSPITAL_COMMUNITY)
Admission: RE | Admit: 2022-10-16 | Discharge: 2022-10-16 | Disposition: A | Discharge status: Home / Self Care | Payer: Medicaid Other | Source: Ambulatory Visit | Attending: Nephrology | Admitting: Nephrology

## 2022-10-16 VITALS — BP 172/94 | HR 78

## 2022-10-16 DIAGNOSIS — N185 Chronic kidney disease, stage 5: Secondary | ICD-10-CM | POA: Insufficient documentation

## 2022-10-16 DIAGNOSIS — D631 Anemia in chronic kidney disease: Secondary | ICD-10-CM | POA: Insufficient documentation

## 2022-10-16 LAB — RENAL FUNCTION PANEL
Albumin: 3.8 g/dL (ref 3.5–5.0)
Anion gap: 9 (ref 5–15)
BUN: 38 mg/dL — ABNORMAL HIGH (ref 6–20)
CO2: 22 mmol/L (ref 22–32)
Calcium: 9.6 mg/dL (ref 8.9–10.3)
Chloride: 108 mmol/L (ref 98–111)
Creatinine, Ser: 3.99 mg/dL — ABNORMAL HIGH (ref 0.44–1.00)
GFR, Estimated: 13 mL/min — ABNORMAL LOW (ref >60)
Glucose, Bld: 118 mg/dL — ABNORMAL HIGH (ref 70–99)
Phosphorus: 4.5 mg/dL (ref 2.5–4.6)
Potassium: 3.8 mmol/L (ref 3.5–5.1)
Sodium: 139 mmol/L (ref 135–145)

## 2022-10-16 LAB — FERRITIN: Ferritin: 46 ng/mL (ref 11–307)

## 2022-10-16 LAB — IRON AND TIBC
Iron: 57 ug/dL (ref 28–170)
Saturation Ratios: 17 % (ref 10.4–31.8)
TIBC: 328 ug/dL (ref 250–450)
UIBC: 271 ug/dL

## 2022-10-16 LAB — POCT HEMOGLOBIN-HEMACUE: Hemoglobin: 10.5 g/dL — ABNORMAL LOW (ref 12.0–15.0)

## 2022-10-16 MED ORDER — EPOETIN ALFA-EPBX 10000 UNIT/ML IJ SOLN
INTRAMUSCULAR | Status: AC
  Filled 2022-10-16: qty 2

## 2022-10-16 MED ORDER — EPOETIN ALFA-EPBX 10000 UNIT/ML IJ SOLN
20000.0000 [IU] | INTRAMUSCULAR | Status: DC
  Administered 2022-10-16: 20000 [IU] via SUBCUTANEOUS

## 2022-10-17 ENCOUNTER — Encounter: Payer: Self-pay | Admitting: Vascular Surgery

## 2022-10-17 ENCOUNTER — Ambulatory Visit (INDEPENDENT_AMBULATORY_CARE_PROVIDER_SITE_OTHER): Payer: Medicaid Other | Admitting: Vascular Surgery

## 2022-10-17 VITALS — BP 152/85 | HR 85 | Temp 98.1°F | Resp 20 | Ht 64.0 in | Wt 215.0 lb

## 2022-10-17 DIAGNOSIS — N185 Chronic kidney disease, stage 5: Secondary | ICD-10-CM | POA: Diagnosis not present

## 2022-10-17 LAB — PTH, INTACT AND CALCIUM
Calcium, Total (PTH): 9.9 mg/dL (ref 8.7–10.2)
PTH: 503 pg/mL — ABNORMAL HIGH (ref 15–65)

## 2022-10-17 NOTE — Progress Notes (Signed)
Patient ID: Renee Alexander, female   DOB: 05/09/1971, 52 y.o.   MRN: [id number]  Reason for Consult: New Patient (Initial Visit)   Referred by Rexene Agent, MD  Subjective:     HPI:  Renee Alexander is a 52 y.o. female has never been on dialysis.  She has hypertension and diabetes has risk factors.  Plan is for peritoneal dialysis.  She does have a history of C-section as well as repeat operation of her abdomen for hysterectomy and also has an incision around her umbilicus for tubal ligation.  No other surgeries in the past.  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   Papilloma of left breast    SVD (spontaneous vaginal delivery)    x 4   Family History  Problem Relation Age of Onset   Drug abuse Mother    Diabetes Mellitus II Father    Liver disease Father    Breast cancer Sister    Colon cancer Neg Hx    Colon polyps Neg Hx    Esophageal cancer Neg Hx    Stomach cancer Neg Hx    Rectal cancer Neg Hx    Past Surgical History:  Procedure Laterality Date   BREAST LUMPECTOMY WITH RADIOACTIVE SEED LOCALIZATION Left 04/09/2018   Procedure: BREAST LUMPECTOMY WITH RADIOACTIVE SEED LOCALIZATION;  Surgeon: Fanny Skates, MD;  Location: [facility];  Service: General;  Laterality: Left;   CESAREAN SECTION     x 1-twins   HYSTERECTOMY ABDOMINAL WITH SALPINGECTOMY Bilateral 07/29/2018   Procedure: HYSTERECTOMY ABDOMINAL WITH SALPINGECTOMY;  Surgeon: Sloan Leiter, MD;  Location: [facility];  Service: Gynecology;  Laterality: Bilateral;   TUBAL LIGATION     x2   WISDOM TOOTH EXTRACTION      Short Social History:  Social History   Tobacco Use    Smoking status: Never   Smokeless tobacco: Never  Substance Use Topics   Alcohol use: No    Allergies  Allergen Reactions   Penicillins Hives, Other (See Comments), Itching and Rash    Has patient had a PCN reaction causing immediate rash, facial/tongue/throat swelling, SOB or lightheadedness with hypotension: No Has patient had a PCN reaction causing severe rash involving mucus membranes or skin necrosis: No Has patient had a PCN reaction that required hospitalization No Has patient had a PCN reaction occurring within the last 10 years: }No If all of the above answers are "NO", then may proceed with Cephalosporin  Has patient had a PCN reaction causing immediate rash, facial/tongue/throat swelling, SOB or lightheadedness with hypotension: No Has patient had a PCN reaction causing severe rash involving mucus membranes or skin necrosis: No Has patient had a PCN reaction that required hospitalization No Has patient had a PCN reaction occurring within the last 10 years: }No If all of the above answers are "NO", then may proceed with Cephalosporin     Current Outpatient Medications  Medication Sig Dispense Refill   albuterol (PROVENTIL HFA;VENTOLIN HFA) 108 (90 BASE) MCG/ACT inhaler Inhale 2 puffs into the lungs every 6 (six) hours as  needed for wheezing or shortness of breath. 1 Inhaler 2   amLODipine (NORVASC) 10 MG tablet Take 1 tablet (10 mg total) by mouth daily. 30 tablet 0   dapagliflozin propanediol (FARXIGA) 10 MG TABS tablet Take by mouth.     hydrALAZINE (APRESOLINE) 25 MG tablet Take 25 mg by mouth 3 (three) times daily.     liraglutide (VICTOZA) 18 MG/3ML SOPN Victoza 3-Pak 0.6 mg/0.1 mL (18 mg/3 mL) subcutaneous pen injector  ADMINISTER 1.8 MG UNDER THE SKIN EVERY DAY     losartan (COZAAR) 100 MG tablet Take 100 mg by mouth daily.     metoprolol succinate (TOPROL-XL) 100 MG 24 hr tablet Take 100 mg by mouth daily. Take with or immediately following a meal.     omeprazole  (PRILOSEC) 20 MG capsule Take 20 mg by mouth daily.     sodium bicarbonate 650 MG tablet sodium bicarbonate 650 mg tablet  TAKE 1 TABLET BY MOUTH TWICE DAILY     spironolactone (ALDACTONE) 100 MG tablet Take 100 mg by mouth daily.     metFORMIN (GLUCOPHAGE) 500 MG tablet Take 500 mg by mouth 2 (two) times daily with a meal.  (Patient not taking: Reported on 03/08/2021)     No current facility-administered medications for this visit.    Review of Systems  Constitutional:  Constitutional negative. HENT: HENT negative.  Eyes: Eyes negative.  Respiratory: Respiratory negative.  Cardiovascular: Cardiovascular negative.  GI: Gastrointestinal negative.  Musculoskeletal: Musculoskeletal negative.  Skin: Skin negative.  Neurological: Neurological negative. Hematologic: Hematologic/lymphatic negative.  Psychiatric: Psychiatric negative.        Objective:  Objective   Vitals:   10/17/22 1425  BP: (!) 152/85  Pulse: 85  Resp: 20  Temp: 98.1 F (36.7 C)  SpO2: 95%  Weight: 215 lb (97.5 kg)  Height: '5\' 4"'$  (1.626 m)   Body mass index is 36.9 kg/m.  Physical Exam HENT:     Head: Normocephalic.     Mouth/Throat:     Mouth: Mucous membranes are moist.  Eyes:     Pupils: Pupils are equal, round, and reactive to light.  Cardiovascular:     Pulses: Normal pulses.  Pulmonary:     Effort: Pulmonary effort is normal.  Abdominal:     General: Abdomen is flat.     Palpations: Abdomen is soft.  Musculoskeletal:        General: Normal range of motion.     Cervical back: Normal range of motion.     Right lower leg: No edema.     Left lower leg: No edema.  Skin:    General: Skin is warm.     Capillary Refill: Capillary refill takes less than 2 seconds.  Neurological:     General: No focal deficit present.     Mental Status: She is alert.  Psychiatric:        Mood and Affect: Mood normal.        Behavior: Behavior normal.        Thought Content: Thought content normal.         Judgment: Judgment normal.         Assessment/Plan:    52 year old female with advanced chronic kidney disease not currently on dialysis.  She has elected for peritoneal dialysis catheter placement.  She does have previous abdominal surgery with C-section and hysterectomy as well as tubal ligation but no other upper abdominal surgeries.  High risk nature of reoperative abdominal surgery possibility of primary nonfunction of  the catheter and possibility of the injury to intra-abdominal structures she demonstrates good understanding agree with plan this in the near future is laparoscopic continuous ambulatory PD catheter placement.     Waynetta Sandy MD [facility]

## 2022-10-17 NOTE — H&P (View-Only) (Signed)
 Patient ID: Renee Alexander, female   DOB: 01/01/1971, 52 y.o.   MRN: [id number]  Reason for Consult: New Patient (Initial Visit)   Referred by Sanford, Ryan B, MD  Subjective:     HPI:  Renee Alexander is a 52 y.o. female has never been on dialysis.  She has hypertension and diabetes has risk factors.  Plan is for peritoneal dialysis.  She does have a history of C-section as well as repeat operation of her abdomen for hysterectomy and also has an incision around her umbilicus for tubal ligation.  No other surgeries in the past.  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   Papilloma of left breast    SVD (spontaneous vaginal delivery)    x 4   Family History  Problem Relation Age of Onset   Drug abuse Mother    Diabetes Mellitus II Father    Liver disease Father    Breast cancer Sister    Colon cancer Neg Hx    Colon polyps Neg Hx    Esophageal cancer Neg Hx    Stomach cancer Neg Hx    Rectal cancer Neg Hx    Past Surgical History:  Procedure Laterality Date   BREAST LUMPECTOMY WITH RADIOACTIVE SEED LOCALIZATION Left 04/09/2018   Procedure: BREAST LUMPECTOMY WITH RADIOACTIVE SEED LOCALIZATION;  Surgeon: Ingram, Haywood, MD;  Location: [facility];  Service: General;  Laterality: Left;   CESAREAN SECTION     x 1-twins   HYSTERECTOMY ABDOMINAL WITH SALPINGECTOMY Bilateral 07/29/2018   Procedure: HYSTERECTOMY ABDOMINAL WITH SALPINGECTOMY;  Surgeon: Davis, Kelly M, MD;  Location: [facility];  Service: Gynecology;  Laterality: Bilateral;   TUBAL LIGATION     x2   WISDOM TOOTH EXTRACTION      Short Social History:  Social History   Tobacco Use    Smoking status: Never   Smokeless tobacco: Never  Substance Use Topics   Alcohol use: No    Allergies  Allergen Reactions   Penicillins Hives, Other (See Comments), Itching and Rash    Has patient had a PCN reaction causing immediate rash, facial/tongue/throat swelling, SOB or lightheadedness with hypotension: No Has patient had a PCN reaction causing severe rash involving mucus membranes or skin necrosis: No Has patient had a PCN reaction that required hospitalization No Has patient had a PCN reaction occurring within the last 10 years: }No If all of the above answers are "NO", then may proceed with Cephalosporin  Has patient had a PCN reaction causing immediate rash, facial/tongue/throat swelling, SOB or lightheadedness with hypotension: No Has patient had a PCN reaction causing severe rash involving mucus membranes or skin necrosis: No Has patient had a PCN reaction that required hospitalization No Has patient had a PCN reaction occurring within the last 10 years: }No If all of the above answers are "NO", then may proceed with Cephalosporin     Current Outpatient Medications  Medication Sig Dispense Refill   albuterol (PROVENTIL HFA;VENTOLIN HFA) 108 (90 BASE) MCG/ACT inhaler Inhale 2 puffs into the lungs every 6 (six) hours as   needed for wheezing or shortness of breath. 1 Inhaler 2   amLODipine (NORVASC) 10 MG tablet Take 1 tablet (10 mg total) by mouth daily. 30 tablet 0   dapagliflozin propanediol (FARXIGA) 10 MG TABS tablet Take by mouth.     hydrALAZINE (APRESOLINE) 25 MG tablet Take 25 mg by mouth 3 (three) times daily.     liraglutide (VICTOZA) 18 MG/3ML SOPN Victoza 3-Pak 0.6 mg/0.1 mL (18 mg/3 mL) subcutaneous pen injector  ADMINISTER 1.8 MG UNDER THE SKIN EVERY DAY     losartan (COZAAR) 100 MG tablet Take 100 mg by mouth daily.     metoprolol succinate (TOPROL-XL) 100 MG 24 hr tablet Take 100 mg by mouth daily. Take with or immediately following a meal.     omeprazole  (PRILOSEC) 20 MG capsule Take 20 mg by mouth daily.     sodium bicarbonate 650 MG tablet sodium bicarbonate 650 mg tablet  TAKE 1 TABLET BY MOUTH TWICE DAILY     spironolactone (ALDACTONE) 100 MG tablet Take 100 mg by mouth daily.     metFORMIN (GLUCOPHAGE) 500 MG tablet Take 500 mg by mouth 2 (two) times daily with a meal.  (Patient not taking: Reported on 03/08/2021)     No current facility-administered medications for this visit.    Review of Systems  Constitutional:  Constitutional negative. HENT: HENT negative.  Eyes: Eyes negative.  Respiratory: Respiratory negative.  Cardiovascular: Cardiovascular negative.  GI: Gastrointestinal negative.  Musculoskeletal: Musculoskeletal negative.  Skin: Skin negative.  Neurological: Neurological negative. Hematologic: Hematologic/lymphatic negative.  Psychiatric: Psychiatric negative.        Objective:  Objective   Vitals:   10/17/22 1425  BP: (!) 152/85  Pulse: 85  Resp: 20  Temp: 98.1 F (36.7 C)  SpO2: 95%  Weight: 215 lb (97.5 kg)  Height: 5' 4" (1.626 m)   Body mass index is 36.9 kg/m.  Physical Exam HENT:     Head: Normocephalic.     Mouth/Throat:     Mouth: Mucous membranes are moist.  Eyes:     Pupils: Pupils are equal, round, and reactive to light.  Cardiovascular:     Pulses: Normal pulses.  Pulmonary:     Effort: Pulmonary effort is normal.  Abdominal:     General: Abdomen is flat.     Palpations: Abdomen is soft.  Musculoskeletal:        General: Normal range of motion.     Cervical back: Normal range of motion.     Right lower leg: No edema.     Left lower leg: No edema.  Skin:    General: Skin is warm.     Capillary Refill: Capillary refill takes less than 2 seconds.  Neurological:     General: No focal deficit present.     Mental Status: She is alert.  Psychiatric:        Mood and Affect: Mood normal.        Behavior: Behavior normal.        Thought Content: Thought content normal.         Judgment: Judgment normal.         Assessment/Plan:    52-year-old female with advanced chronic kidney disease not currently on dialysis.  She has elected for peritoneal dialysis catheter placement.  She does have previous abdominal surgery with C-section and hysterectomy as well as tubal ligation but no other upper abdominal surgeries.  High risk nature of reoperative abdominal surgery possibility of primary nonfunction of   the catheter and possibility of the injury to intra-abdominal structures she demonstrates good understanding agree with plan this in the near future is laparoscopic continuous ambulatory PD catheter placement.     Paola Flynt Christopher Carole Doner MD [facility]   

## 2022-10-19 ENCOUNTER — Other Ambulatory Visit: Payer: Self-pay

## 2022-10-19 DIAGNOSIS — N185 Chronic kidney disease, stage 5: Secondary | ICD-10-CM

## 2022-10-24 ENCOUNTER — Telehealth: Payer: Self-pay

## 2022-10-24 NOTE — Telephone Encounter (Signed)
Spoke with patient to update on arrival time for surgery on March 14 to 0630 AM to [facility] admitting. Patient verbalized understanding.

## 2022-10-30 ENCOUNTER — Encounter (HOSPITAL_COMMUNITY): Payer: Self-pay | Admitting: Vascular Surgery

## 2022-10-30 ENCOUNTER — Other Ambulatory Visit: Payer: Self-pay

## 2022-10-30 NOTE — Progress Notes (Signed)
Ms Gabay denies chest pain or shortness of breath.  Patient denies having any s/s of Covid in her household, also denies any known exposure to Covid. Ms Hermsen denies any s/s of upper or lower respiratory infections in the past 2 months.  Ms Francis' PCP is with [facility].  Nephrologist is Dr Joelyn Oms.  Ms Schmeer has type II diabetes. Ms Olivera is on [place], she took it this morning, I instructed patient to not take it again until after surgery. Patient is on Victoza injections daily I instructed patient to not take VIctoza in am. Ms Kitchens reports that CBG's run around 130. I instructed patient to check CBG after awaking and every 2 hours until arrival  to the hospital.  I Instructed Ms Wilkinson if CBG is less than 70 to take 4 Glucose Tablets or 1 tube of Glucose Gel or 1/2 cup of a clear juice. Recheck CBG in 15 minutes if CBG is not over 70 call, pre- op desk at [phone] for further instructions.

## 2022-10-31 NOTE — Progress Notes (Signed)
Left a message to pt's voicemail to arrive tom at 0630.

## 2022-11-01 ENCOUNTER — Ambulatory Visit (HOSPITAL_BASED_OUTPATIENT_CLINIC_OR_DEPARTMENT_OTHER): Payer: Medicaid Other | Admitting: Anesthesiology

## 2022-11-01 ENCOUNTER — Encounter (HOSPITAL_COMMUNITY): Payer: Self-pay | Admitting: Vascular Surgery

## 2022-11-01 ENCOUNTER — Ambulatory Visit (HOSPITAL_COMMUNITY)
Admission: RE | Admit: 2022-11-01 | Discharge: 2022-11-01 | Disposition: A | Discharge status: Home / Self Care | Payer: Medicaid Other | Attending: Vascular Surgery | Admitting: Vascular Surgery

## 2022-11-01 ENCOUNTER — Ambulatory Visit (HOSPITAL_COMMUNITY): Payer: Medicaid Other | Admitting: Anesthesiology

## 2022-11-01 ENCOUNTER — Other Ambulatory Visit: Payer: Self-pay

## 2022-11-01 ENCOUNTER — Encounter (HOSPITAL_COMMUNITY)
Admission: RE | Disposition: A | Discharge status: Home / Self Care | Payer: Self-pay | Source: Home / Self Care | Attending: Vascular Surgery

## 2022-11-01 DIAGNOSIS — Z7984 Long term (current) use of oral hypoglycemic drugs: Secondary | ICD-10-CM | POA: Insufficient documentation

## 2022-11-01 DIAGNOSIS — J45909 Unspecified asthma, uncomplicated: Secondary | ICD-10-CM | POA: Insufficient documentation

## 2022-11-01 DIAGNOSIS — N185 Chronic kidney disease, stage 5: Secondary | ICD-10-CM | POA: Diagnosis not present

## 2022-11-01 DIAGNOSIS — I120 Hypertensive chronic kidney disease with stage 5 chronic kidney disease or end stage renal disease: Secondary | ICD-10-CM

## 2022-11-01 DIAGNOSIS — Z79899 Other long term (current) drug therapy: Secondary | ICD-10-CM | POA: Diagnosis not present

## 2022-11-01 DIAGNOSIS — E1122 Type 2 diabetes mellitus with diabetic chronic kidney disease: Secondary | ICD-10-CM | POA: Diagnosis not present

## 2022-11-01 DIAGNOSIS — Z992 Dependence on renal dialysis: Secondary | ICD-10-CM | POA: Diagnosis not present

## 2022-11-01 DIAGNOSIS — Z833 Family history of diabetes mellitus: Secondary | ICD-10-CM | POA: Insufficient documentation

## 2022-11-01 DIAGNOSIS — K219 Gastro-esophageal reflux disease without esophagitis: Secondary | ICD-10-CM | POA: Diagnosis not present

## 2022-11-01 HISTORY — PX: CAPD INSERTION: SHX5233

## 2022-11-01 LAB — POCT I-STAT, CHEM 8
BUN: 40 mg/dL — ABNORMAL HIGH (ref 6–20)
Calcium, Ion: 1.26 mmol/L (ref 1.15–1.40)
Chloride: 114 mmol/L — ABNORMAL HIGH (ref 98–111)
Creatinine, Ser: 5.3 mg/dL — ABNORMAL HIGH (ref 0.44–1.00)
Glucose, Bld: 119 mg/dL — ABNORMAL HIGH (ref 70–99)
HCT: 31 % — ABNORMAL LOW (ref 36.0–46.0)
Hemoglobin: 10.5 g/dL — ABNORMAL LOW (ref 12.0–15.0)
Potassium: 4 mmol/L (ref 3.5–5.1)
Sodium: 143 mmol/L (ref 135–145)
TCO2: 19 mmol/L — ABNORMAL LOW (ref 22–32)

## 2022-11-01 LAB — GLUCOSE, CAPILLARY
Glucose-Capillary: 121 mg/dL — ABNORMAL HIGH (ref 70–99)
Glucose-Capillary: 161 mg/dL — ABNORMAL HIGH (ref 70–99)

## 2022-11-01 SURGERY — LAPAROSCOPIC INSERTION CONTINUOUS AMBULATORY PERITONEAL DIALYSIS  (CAPD) CATHETER
Anesthesia: General | Site: Abdomen

## 2022-11-01 MED ORDER — CHLORHEXIDINE GLUCONATE 0.12 % MT SOLN
15.0000 mL | Freq: Once | OROMUCOSAL | Status: AC
  Administered 2022-11-01: 15 mL via OROMUCOSAL
  Filled 2022-11-01: qty 15

## 2022-11-01 MED ORDER — PHENYLEPHRINE 80 MCG/ML (10ML) SYRINGE FOR IV PUSH (FOR BLOOD PRESSURE SUPPORT)
PREFILLED_SYRINGE | INTRAVENOUS | Status: DC | PRN
  Administered 2022-11-01 (×5): 80 ug via INTRAVENOUS

## 2022-11-01 MED ORDER — ROCURONIUM BROMIDE 10 MG/ML (PF) SYRINGE
PREFILLED_SYRINGE | INTRAVENOUS | Status: AC
  Filled 2022-11-01: qty 10

## 2022-11-01 MED ORDER — FENTANYL CITRATE (PF) 100 MCG/2ML IJ SOLN
INTRAMUSCULAR | Status: AC
  Filled 2022-11-01: qty 2

## 2022-11-01 MED ORDER — LIDOCAINE-EPINEPHRINE (PF) 1 %-1:200000 IJ SOLN
INTRAMUSCULAR | Status: DC | PRN
  Administered 2022-11-01: 4.2 mL

## 2022-11-01 MED ORDER — SODIUM CHLORIDE 0.9 % IV SOLN: INTRAVENOUS | Status: DC

## 2022-11-01 MED ORDER — ONDANSETRON HCL 4 MG/2ML IJ SOLN
INTRAMUSCULAR | Status: DC | PRN
  Administered 2022-11-01: 4 mg via INTRAVENOUS

## 2022-11-01 MED ORDER — FENTANYL CITRATE (PF) 100 MCG/2ML IJ SOLN
25.0000 ug | INTRAMUSCULAR | Status: DC | PRN
  Administered 2022-11-01: 25 ug via INTRAVENOUS

## 2022-11-01 MED ORDER — PHENYLEPHRINE 80 MCG/ML (10ML) SYRINGE FOR IV PUSH (FOR BLOOD PRESSURE SUPPORT)
PREFILLED_SYRINGE | INTRAVENOUS | Status: AC
  Filled 2022-11-01: qty 10

## 2022-11-01 MED ORDER — ORAL CARE MOUTH RINSE: 15.0000 mL | Freq: Once | OROMUCOSAL | Status: AC

## 2022-11-01 MED ORDER — LIDOCAINE 2% (20 MG/ML) 5 ML SYRINGE
INTRAMUSCULAR | Status: AC
  Filled 2022-11-01: qty 5

## 2022-11-01 MED ORDER — FENTANYL CITRATE (PF) 250 MCG/5ML IJ SOLN
INTRAMUSCULAR | Status: AC
  Filled 2022-11-01: qty 5

## 2022-11-01 MED ORDER — SUCCINYLCHOLINE CHLORIDE 200 MG/10ML IV SOSY
PREFILLED_SYRINGE | INTRAVENOUS | Status: DC | PRN
  Administered 2022-11-01: 120 mg via INTRAVENOUS

## 2022-11-01 MED ORDER — ACETAMINOPHEN 500 MG PO TABS: 1000.0000 mg | ORAL_TABLET | Freq: Once | ORAL | Status: AC

## 2022-11-01 MED ORDER — LIDOCAINE-EPINEPHRINE (PF) 1 %-1:200000 IJ SOLN
INTRAMUSCULAR | Status: AC
  Filled 2022-11-01: qty 30

## 2022-11-01 MED ORDER — SUCCINYLCHOLINE CHLORIDE 200 MG/10ML IV SOSY
PREFILLED_SYRINGE | INTRAVENOUS | Status: AC
  Filled 2022-11-01: qty 10

## 2022-11-01 MED ORDER — VANCOMYCIN HCL 1500 MG/300ML IV SOLN
1500.0000 mg | INTRAVENOUS | Status: AC
  Administered 2022-11-01: 1500 mg via INTRAVENOUS
  Filled 2022-11-01: qty 300

## 2022-11-01 MED ORDER — DEXAMETHASONE SODIUM PHOSPHATE 10 MG/ML IJ SOLN
INTRAMUSCULAR | Status: DC | PRN
  Administered 2022-11-01: 5 mg via INTRAVENOUS

## 2022-11-01 MED ORDER — CHLORHEXIDINE GLUCONATE 4 % EX LIQD: 60.0000 mL | Freq: Once | CUTANEOUS | Status: DC

## 2022-11-01 MED ORDER — ROCURONIUM BROMIDE 10 MG/ML (PF) SYRINGE
PREFILLED_SYRINGE | INTRAVENOUS | Status: DC | PRN
  Administered 2022-11-01: 50 mg via INTRAVENOUS

## 2022-11-01 MED ORDER — LIDOCAINE 2% (20 MG/ML) 5 ML SYRINGE
INTRAMUSCULAR | Status: DC | PRN
  Administered 2022-11-01: 80 mg via INTRAVENOUS

## 2022-11-01 MED ORDER — PROPOFOL 10 MG/ML IV BOLUS
INTRAVENOUS | Status: DC | PRN
  Administered 2022-11-01 (×2): 20 mg via INTRAVENOUS
  Administered 2022-11-01: 130 mg via INTRAVENOUS

## 2022-11-01 MED ORDER — OXYCODONE HCL 5 MG PO TABS: 5.0000 mg | ORAL_TABLET | Freq: Once | ORAL | Status: DC | PRN

## 2022-11-01 MED ORDER — DEXAMETHASONE SODIUM PHOSPHATE 10 MG/ML IJ SOLN
INTRAMUSCULAR | Status: AC
  Filled 2022-11-01: qty 1

## 2022-11-01 MED ORDER — INSULIN ASPART 100 UNIT/ML IJ SOLN: 0.0000 [IU] | INTRAMUSCULAR | Status: DC | PRN

## 2022-11-01 MED ORDER — MIDAZOLAM HCL 2 MG/2ML IJ SOLN
INTRAMUSCULAR | Status: AC
  Filled 2022-11-01: qty 2

## 2022-11-01 MED ORDER — OXYCODONE-ACETAMINOPHEN 5-325 MG PO TABS: 1.0000 | ORAL_TABLET | Freq: Four times a day (QID) | ORAL | 0 refills | Status: DC | PRN

## 2022-11-01 MED ORDER — SUGAMMADEX SODIUM 200 MG/2ML IV SOLN
INTRAVENOUS | Status: DC | PRN
  Administered 2022-11-01: 200 mg via INTRAVENOUS

## 2022-11-01 MED ORDER — SODIUM CHLORIDE 0.9 % IR SOLN
Status: DC | PRN
  Administered 2022-11-01: 1000 mL

## 2022-11-01 MED ORDER — ACETAMINOPHEN 500 MG PO TABS
ORAL_TABLET | ORAL | Status: AC
  Administered 2022-11-01: 1000 mg via ORAL
  Filled 2022-11-01: qty 2

## 2022-11-01 MED ORDER — 0.9 % SODIUM CHLORIDE (POUR BTL) OPTIME
TOPICAL | Status: DC | PRN
  Administered 2022-11-01: 1000 mL

## 2022-11-01 MED ORDER — MIDAZOLAM HCL 2 MG/2ML IJ SOLN
INTRAMUSCULAR | Status: DC | PRN
  Administered 2022-11-01: 2 mg via INTRAVENOUS

## 2022-11-01 MED ORDER — ONDANSETRON HCL 4 MG/2ML IJ SOLN: 4.0000 mg | Freq: Once | INTRAMUSCULAR | Status: DC | PRN

## 2022-11-01 MED ORDER — ONDANSETRON HCL 4 MG/2ML IJ SOLN
INTRAMUSCULAR | Status: AC
  Filled 2022-11-01: qty 2

## 2022-11-01 MED ORDER — OXYCODONE HCL 5 MG/5ML PO SOLN: 5.0000 mg | Freq: Once | ORAL | Status: DC | PRN

## 2022-11-01 MED ORDER — FENTANYL CITRATE (PF) 250 MCG/5ML IJ SOLN
INTRAMUSCULAR | Status: DC | PRN
  Administered 2022-11-01: 25 ug via INTRAVENOUS
  Administered 2022-11-01: 50 ug via INTRAVENOUS

## 2022-11-01 SURGICAL SUPPLY — 59 items
ADAPTER TITANIUM MEDIONICS (MISCELLANEOUS) ×2 IMPLANT
ADH SKN CLS APL DERMABOND .7 (GAUZE/BANDAGES/DRESSINGS) ×2
ADPR DLYS CATH STRL LF DISP (MISCELLANEOUS) ×2
APL PRP STRL LF DISP 70% ISPRP (MISCELLANEOUS) ×2
APPLIER CLIP 5 13 M/L LIGAMAX5 (MISCELLANEOUS)
APR CLP MED LRG 5 ANG JAW (MISCELLANEOUS)
BAG DECANTER FOR FLEXI CONT (MISCELLANEOUS) ×2 IMPLANT
BIOPATCH RED 1 DISK 7.0 (GAUZE/BANDAGES/DRESSINGS) ×2 IMPLANT
BLADE CLIPPER SURG (BLADE) IMPLANT
BLADE SURG 11 STRL SS (BLADE) ×2 IMPLANT
CATH EXTENDED DIALYSIS (CATHETERS) ×1 IMPLANT
CHLORAPREP W/TINT 26 (MISCELLANEOUS) ×2 IMPLANT
CLIP APPLIE 5 13 M/L LIGAMAX5 (MISCELLANEOUS) IMPLANT
COVER SURGICAL LIGHT HANDLE (MISCELLANEOUS) ×2 IMPLANT
DERMABOND ADVANCED .7 DNX12 (GAUZE/BANDAGES/DRESSINGS) ×2 IMPLANT
DEVICE TROCAR PUNCTURE CLOSURE (ENDOMECHANICALS) ×2 IMPLANT
DRSG IV TEGADERM 3.5X4.5 STRL (GAUZE/BANDAGES/DRESSINGS) ×2 IMPLANT
DRSG TEGADERM 4X4.75 (GAUZE/BANDAGES/DRESSINGS) ×4 IMPLANT
ELECT REM PT RETURN 9FT ADLT (ELECTROSURGICAL) ×2
ELECTRODE REM PT RTRN 9FT ADLT (ELECTROSURGICAL) ×2 IMPLANT
GAUZE SPONGE 2X2 8PLY STRL LF (GAUZE/BANDAGES/DRESSINGS) ×3 IMPLANT
GAUZE SPONGE 4X4 12PLY STRL (GAUZE/BANDAGES/DRESSINGS) ×2 IMPLANT
GLOVE INDICATOR 6.5 STRL GRN (GLOVE) ×2 IMPLANT
GLOVE SURG UNDER LTX SZ7.5 (GLOVE) ×2 IMPLANT
GOWN STRL REUS W/ TWL LRG LVL3 (GOWN DISPOSABLE) ×4 IMPLANT
GOWN STRL REUS W/ TWL XL LVL3 (GOWN DISPOSABLE) ×2 IMPLANT
GOWN STRL REUS W/TWL LRG LVL3 (GOWN DISPOSABLE) ×4
GOWN STRL REUS W/TWL XL LVL3 (GOWN DISPOSABLE) ×2
GRASPER SUT TROCAR 14GX15 (MISCELLANEOUS) ×2 IMPLANT
IRRIG SUCT STRYKERFLOW 2 WTIP (MISCELLANEOUS)
IRRIGATION SUCT STRKRFLW 2 WTP (MISCELLANEOUS) IMPLANT
IV NS 1000ML (IV SOLUTION) ×2
IV NS 1000ML BAXH (IV SOLUTION) ×2 IMPLANT
KIT BASIN OR (CUSTOM PROCEDURE TRAY) ×2 IMPLANT
KIT TURNOVER KIT B (KITS) ×2 IMPLANT
NDL INSUFFLATION 14GA 120MM (NEEDLE) ×1 IMPLANT
NEEDLE INSUFFLATION 14GA 120MM (NEEDLE) ×2 IMPLANT
NS IRRIG 1000ML POUR BTL (IV SOLUTION) ×2 IMPLANT
PAD ARMBOARD 7.5X6 YLW CONV (MISCELLANEOUS) ×4 IMPLANT
POWDER SURGICEL 3.0 GRAM (HEMOSTASIS) IMPLANT
SCISSORS LAP 5X35 DISP (ENDOMECHANICALS) IMPLANT
SET CYSTO W/LG BORE CLAMP LF (SET/KITS/TRAYS/PACK) ×2 IMPLANT
SET EXT 12IN DIALYSIS STAY-SAF (MISCELLANEOUS) ×2 IMPLANT
SET TUBE SMOKE EVAC HIGH FLOW (TUBING) ×2 IMPLANT
SLEEVE Z-THREAD 5X100MM (TROCAR) ×4 IMPLANT
STYLET FALLER (MISCELLANEOUS) ×2 IMPLANT
STYLET FALLER MEDIONICS (MISCELLANEOUS) ×2 IMPLANT
SUT MNCRL AB 4-0 PS2 18 (SUTURE) ×2 IMPLANT
SUT PROLENE 0 SH 30 (SUTURE) ×4 IMPLANT
SUT SILK 0 TIES 10X30 (SUTURE) ×2 IMPLANT
TAPE CLOTH SURG 4X10 WHT LF (GAUZE/BANDAGES/DRESSINGS) ×1 IMPLANT
TOWEL GREEN STERILE (TOWEL DISPOSABLE) ×2 IMPLANT
TOWEL GREEN STERILE FF (TOWEL DISPOSABLE) ×2 IMPLANT
TRAY LAPAROSCOPIC MC (CUSTOM PROCEDURE TRAY) ×2 IMPLANT
TROCAR 11X100 Z THREAD (TROCAR) IMPLANT
TROCAR 5MMX150MM (TROCAR) ×2 IMPLANT
TROCAR XCEL NON-BLD 5MMX100MML (ENDOMECHANICALS) ×2 IMPLANT
TROCAR Z-THREAD OPTICAL 5X100M (TROCAR) ×2 IMPLANT
WATER STERILE IRR 1000ML POUR (IV SOLUTION) ×2 IMPLANT

## 2022-11-01 NOTE — Discharge Instructions (Signed)
Peritoneal Dialysis Catheter Placement, Care After The following information offers guidance on how to care for yourself after your procedure. Your health care provider may also give you more specific instructions. If you have problems or questions, contact your health care provider. What can I expect after the procedure? After the procedure, it is common to have some pain or discomfort in your abdomen and your incision area. You may need to wait 2 weeks after your procedure before you can start peritoneal dialysis treatment. If you need dialysis before that time, your health care provider may begin peritoneal dialysis treatment early or offer kidney dialysis treatments (hemodialysis) until you heal. Follow these instructions at home: Incision care  Follow instructions from your health care provider about how to take care of your incision or incisions. Make sure you: Wash your hands with soap and water for at least 20 seconds before and after you change your bandage (dressing). If soap and water are not available, use hand sanitizer. Change your dressing only as told by your health care provider. Your health care provider may tell you not to touch or change your dressing. Leave stitches (sutures), staples, skin glue, or adhesive strips in place. These skin closures may need to stay in place for 2 weeks or longer. If adhesive strip edges start to loosen and curl up, you may trim the loose edges. Do not remove adhesive strips completely unless your health care provider tells you to do that. Check your incision areas every day for signs of infection. If you were instructed not to touch or change your dressing, look at your dressing for signs of infection. Check for: Redness, swelling, or more pain. Fluid or blood. Warmth. Pus or a bad smell. Medicines Take over-the-counter and prescription medicines only as told by your health care provider. If you were prescribed an antibiotic medicine, use it as  told by your health care provider. Do not stop using the antibiotic even if you start to feel better. Ask your health care provider if the medicine prescribed to you requires you to avoid driving or using machinery. Driving Do not drive or ride in a car until your health care provider approves. Your seat belt could move the catheter out of position or cause irritation by rubbing on your incision. Activity  Rest and limit your activity. Do not lift anything that is heavier than 10 lb (4.5 kg), or the limit that you are told, until your health care provider says that it is safe. Return to your normal activities as told by your health care provider. Ask your health care provider what activities are safe for you. Managing constipation Your condition may cause constipation. To prevent or treat constipation, you may need to: Drink enough fluid to keep your urine pale yellow. Take over-the-counter or prescription medicines. Eat foods that are high in fiber, such as beans, whole grains, and fresh fruits and vegetables. Limit foods that are high in fat and processed sugars, such as fried or sweet foods. General instructions Do not use any products that contain nicotine or tobacco. These products include cigarettes, chewing tobacco, and vaping devices, such as e-cigarettes. If you need help quitting, ask your health care provider. Follow instructions from your health care provider about eating or drinking restrictions. Do not take baths, swim, or use a hot tub until your health care provider approves. Ask your health care provider if you may take showers. You may only be allowed to take sponge baths. Wear loose-fitting clothing that keeps   the catheter covered so that it cannot get caught on something. Keep your catheter clean and dry. Keep all follow-up visits. This is important. Contact a health care provider if: You have a fever or chills. You have warmth, redness, swelling, or more pain around an  incision. You have fluid or blood coming from an incision. You have pus or a bad smell coming from an incision. You cannot eat or drink without vomiting. Get help right away if: You have problems breathing. You are confused. You have trouble speaking. You have severe pain in your abdomen that does not get better with treatment. You have bright red blood in your stool (feces), or your stool is dark black and looks like tar. These symptoms may represent a serious problem that is an emergency. Do not wait to see if the symptoms will go away. Get medical help right away. Call your local emergency services (911 in the [place] Do not drive yourself to the hospital. Summary After the procedure, it is common to have some pain or discomfort in your abdomen, your incision area, or both. You may have to wait 2 weeks after your procedure before you can start peritoneal dialysis treatment. Check your incision area every day for signs of infection. Get medical help right away if you have severe pain in your abdomen that does not get better with treatment. This information is not intended to replace advice given to you by your health care provider. Make sure you discuss any questions you have with your health care provider. 

## 2022-11-01 NOTE — Transfer of Care (Signed)
Immediate Anesthesia Transfer of Care Note  Patient: Renee Alexander  Procedure(s) Performed: LAPAROSCOPIC INSERTION CONTINUOUS AMBULATORY PERITONEAL DIALYSIS  (CAPD) CATHETER (Abdomen)  Patient Location: PACU  Anesthesia Type:General  Level of Consciousness: awake and alert   Airway & Oxygen Therapy: Patient Spontanous Breathing  Post-op Assessment: Report given to RN and Post -op Vital signs reviewed and stable  Post vital signs: Reviewed and stable  Last Vitals:  Vitals Value Taken Time  BP 130/78 11/01/22 1034  Temp    Pulse 88 11/01/22 1036  Resp 9 11/01/22 1036  SpO2 93 % 11/01/22 1036  Vitals shown include unvalidated device data.  Last Pain:  Vitals:   11/01/22 0721  TempSrc:   PainSc: 0-No pain         Complications: No notable events documented.

## 2022-11-01 NOTE — Anesthesia Preprocedure Evaluation (Addendum)
Anesthesia Evaluation  Patient identified by MRN, date of birth, ID band Patient awake    Reviewed: Allergy & Precautions, H&P , NPO status , Patient's Chart, lab work & pertinent test results  Airway Mallampati: II  TM Distance: >3 FB Neck ROM: Full    Dental no notable dental hx.    Pulmonary asthma    Pulmonary exam normal breath sounds clear to auscultation       Cardiovascular hypertension, Normal cardiovascular exam Rhythm:Regular Rate:Normal     Neuro/Psych negative neurological ROS  negative psych ROS   GI/Hepatic negative GI ROS, Neg liver ROS,,,  Endo/Other  diabetes    Renal/GU DialysisRenal disease  negative genitourinary   Musculoskeletal negative musculoskeletal ROS (+)    Abdominal   Peds negative pediatric ROS (+)  Hematology negative hematology ROS (+)   Anesthesia Other Findings   Reproductive/Obstetrics negative OB ROS                             Anesthesia Physical Anesthesia Plan  ASA: 3  Anesthesia Plan: General   Post-op Pain Management: Minimal or no pain anticipated and Tylenol PO (pre-op)*   Induction: Intravenous  PONV Risk Score and Plan: 3 and Ondansetron, Dexamethasone and Treatment may vary due to age or medical condition  Airway Management Planned: Oral ETT  Additional Equipment:   Intra-op Plan:   Post-operative Plan: Extubation in OR  Informed Consent: I have reviewed the patients History and Physical, chart, labs and discussed the procedure including the risks, benefits and alternatives for the proposed anesthesia with the patient or authorized representative who has indicated his/her understanding and acceptance.     Dental advisory given  Plan Discussed with: CRNA and Surgeon  Anesthesia Plan Comments:        Anesthesia Quick Evaluation

## 2022-11-01 NOTE — Anesthesia Procedure Notes (Signed)
Procedure Name: Intubation Date/Time: 11/01/2022 9:18 AM  Performed by: Dorann Lodge, CRNAPre-anesthesia Checklist: Patient identified, Emergency Drugs available, Suction available and Patient being monitored Patient Re-evaluated:Patient Re-evaluated prior to induction Oxygen Delivery Method: Circle System Utilized Preoxygenation: Pre-oxygenation with 100% oxygen Induction Type: IV induction, Rapid sequence and Cricoid Pressure applied Ventilation: Mask ventilation without difficulty Laryngoscope Size: Mac and 3 Grade View: Grade I Tube type: Oral Tube size: 7.0 mm Number of attempts: 1 Airway Equipment and Method: Stylet Placement Confirmation: ETT inserted through vocal cords under direct vision, positive ETCO2 and breath sounds checked- equal and bilateral Secured at: 22 cm Tube secured with: Tape Dental Injury: Teeth and Oropharynx as per pre-operative assessment  Comments: Placed by EMT student, Royden Purl

## 2022-11-01 NOTE — Anesthesia Postprocedure Evaluation (Signed)
Anesthesia Post Note  Patient: Renee Alexander  Procedure(s) Performed: LAPAROSCOPIC INSERTION CONTINUOUS AMBULATORY PERITONEAL DIALYSIS  (CAPD) CATHETER (Abdomen)     Patient location during evaluation: PACU Anesthesia Type: General Level of consciousness: awake and alert Pain management: pain level controlled Vital Signs Assessment: post-procedure vital signs reviewed and stable Respiratory status: spontaneous breathing, nonlabored ventilation, respiratory function stable and patient connected to nasal cannula oxygen Cardiovascular status: blood pressure returned to baseline and stable Postop Assessment: no apparent nausea or vomiting Anesthetic complications: no  No notable events documented.  Last Vitals:  Vitals:   11/01/22 1115 11/01/22 1130  BP: 136/83 130/80  Pulse: 80 66  Resp: 15 11  Temp:    SpO2: 94% 96%    Last Pain:  Vitals:   11/01/22 1130  TempSrc:   PainSc: Asleep                 Dealva Lafoy S

## 2022-11-01 NOTE — Interval H&P Note (Signed)
History and Physical Interval Note:  11/01/2022 7:20 AM  [place] Holiday  has presented today for surgery, with the diagnosis of CKD V.  The various methods of treatment have been discussed with the patient and family. After consideration of risks, benefits and other options for treatment, the patient has consented to  Procedure(s): LAPAROSCOPIC INSERTION CONTINUOUS AMBULATORY PERITONEAL DIALYSIS  (CAPD) CATHETER (N/A) POSSIBLE LAPAROSCOPIC OMENTOPEXY (N/A) as a surgical intervention.  The patient's history has been reviewed, patient examined, no change in status, stable for surgery.  I have reviewed the patient's chart and labs.  Questions were answered to the patient's satisfaction.     Servando Snare

## 2022-11-01 NOTE — Op Note (Signed)
    Patient name: Renee Alexander MRN: [id number] DOB: 06-26-1971 Sex: female  11/01/2022 Pre-operative Diagnosis: Chronic kidney disease stage V Post-operative diagnosis:  Same Surgeon:  Eda Paschal. Donzetta Matters, MD Assistant: Leontine Locket, PA Procedure Performed: Laparoscopic continuous ambulatory peritoneal dialysis catheter placement  Indications: 52 year old female with chronic kidney disease stage V will require dialysis soon and is elected for peritoneal dialysis and presents today for laparoscopic peritoneal dialysis catheter placement.  Experience assistant was necessary to facilitate camera exposure and placement of the catheter with correct positioning in the pelvis.  Findings: There is a large umbilical adhesion including omentum and small bowel.  There is no uterus present the catheter was placed in the pelvis.  No omentopexy was performed given the adhesions in the mid abdomen.  At completion we instilled 800 cc of fluid and 800 cc were quickly returned.   Procedure:  The patient was identified in the holding area and taken to the operating where she is placed upon operative when general anesthesia induced.  She sterilely prepped draped in the abdomen in usual fashion, antibiotics were minister timeout was called.  We began using Veress needle in the left upper quadrant.  Skin nick was created.  Veress needle was used with 2 clicks and was noted to drain saline briskly.  Connected the patient first to low flow insufflation then followed by high flow in the abdomen was noted to easily insufflated.  An Optiview trocar was then used in the right upper quadrant to gain access into the abdomen with direct visualization.  The abdomen was then inspected with 30 degree camera there was noted to be no intra-abdominal injuries from access and the Veress needle was removed.  We then placed a second port with direct visualization in the right upper quadrant.  We then placed patient in Trendelenburg  positioning and mobilized about cephalad.  Catheter was measured to help with the top of the catheter placed at the pubic symphysis and this was then tunneled via counterincision next the umbilicus down to the intraperitoneal space and the cuff was removed and placed into the muscular level.  The catheter self was directed into the pelvis.  Catheter was measured out on the abdomen trimmed to size and assembled and then tunneled cephalad through the Veress needle insertion site and then laterally out above a large skin fold just below the costal margin.  This was then connected to saline and 800 cc was instilled with the patient now in reverse Trendelenburg positioning.  Bag was then dropped and 100 cc was quickly returned.  The ports were then removed.  All incisions were closed with Monocryl and sterile dressing was placed at the catheter exit site.  Dermabond placed the incision site.  Patient was awakened from anesthesia having tolerated procedure without any complication.  All counts were correct at completion.  EBL: 20 cc   Taralyn Ferraiolo C. Donzetta Matters, MD [facility] Office: [phone] Pager: [phone]

## 2022-11-02 ENCOUNTER — Encounter (HOSPITAL_COMMUNITY): Payer: Self-pay | Admitting: Vascular Surgery

## 2022-11-06 DIAGNOSIS — E559 Vitamin D deficiency, unspecified: Secondary | ICD-10-CM | POA: Insufficient documentation

## 2022-11-06 DIAGNOSIS — J45909 Unspecified asthma, uncomplicated: Secondary | ICD-10-CM | POA: Insufficient documentation

## 2022-11-06 DIAGNOSIS — Z7984 Long term (current) use of oral hypoglycemic drugs: Secondary | ICD-10-CM | POA: Insufficient documentation

## 2022-11-06 DIAGNOSIS — N189 Chronic kidney disease, unspecified: Secondary | ICD-10-CM | POA: Insufficient documentation

## 2022-11-06 DIAGNOSIS — Z7985 Long-term (current) use of injectable non-insulin antidiabetic drugs: Secondary | ICD-10-CM | POA: Insufficient documentation

## 2022-11-06 DIAGNOSIS — Z992 Dependence on renal dialysis: Secondary | ICD-10-CM | POA: Insufficient documentation

## 2022-11-06 DIAGNOSIS — I1A0 Resistant hypertension: Secondary | ICD-10-CM | POA: Insufficient documentation

## 2022-11-06 DIAGNOSIS — I120 Hypertensive chronic kidney disease with stage 5 chronic kidney disease or end stage renal disease: Secondary | ICD-10-CM | POA: Insufficient documentation

## 2022-11-08 DIAGNOSIS — N2581 Secondary hyperparathyroidism of renal origin: Secondary | ICD-10-CM | POA: Insufficient documentation

## 2022-11-08 DIAGNOSIS — T782XXA Anaphylactic shock, unspecified, initial encounter: Secondary | ICD-10-CM | POA: Insufficient documentation

## 2022-11-08 DIAGNOSIS — R809 Proteinuria, unspecified: Secondary | ICD-10-CM | POA: Insufficient documentation

## 2022-11-08 DIAGNOSIS — N049 Nephrotic syndrome with unspecified morphologic changes: Secondary | ICD-10-CM | POA: Insufficient documentation

## 2022-11-08 DIAGNOSIS — E2609 Other primary hyperaldosteronism: Secondary | ICD-10-CM | POA: Insufficient documentation

## 2022-11-08 DIAGNOSIS — T7840XA Allergy, unspecified, initial encounter: Secondary | ICD-10-CM | POA: Insufficient documentation

## 2022-11-08 DIAGNOSIS — Z4902 Encounter for fitting and adjustment of peritoneal dialysis catheter: Secondary | ICD-10-CM | POA: Insufficient documentation

## 2022-11-08 DIAGNOSIS — E269 Hyperaldosteronism, unspecified: Secondary | ICD-10-CM | POA: Insufficient documentation

## 2022-11-08 DIAGNOSIS — Z23 Encounter for immunization: Secondary | ICD-10-CM | POA: Insufficient documentation

## 2022-11-08 DIAGNOSIS — N051 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: Secondary | ICD-10-CM | POA: Insufficient documentation

## 2022-11-08 DIAGNOSIS — A609 Anogenital herpesviral infection, unspecified: Secondary | ICD-10-CM | POA: Insufficient documentation

## 2022-11-08 DIAGNOSIS — Z111 Encounter for screening for respiratory tuberculosis: Secondary | ICD-10-CM | POA: Insufficient documentation

## 2022-11-13 ENCOUNTER — Encounter (HOSPITAL_COMMUNITY)
Admission: RE | Admit: 2022-11-13 | Discharge: 2022-11-13 | Disposition: A | Discharge status: Home / Self Care | Payer: Medicare Other | Source: Ambulatory Visit | Attending: Nephrology | Admitting: Nephrology

## 2022-11-13 VITALS — BP 130/76 | HR 87 | Temp 97.7°F | Resp 17

## 2022-11-13 DIAGNOSIS — D631 Anemia in chronic kidney disease: Secondary | ICD-10-CM | POA: Diagnosis present

## 2022-11-13 DIAGNOSIS — N185 Chronic kidney disease, stage 5: Secondary | ICD-10-CM | POA: Diagnosis present

## 2022-11-13 LAB — RENAL FUNCTION PANEL
Albumin: 3.6 g/dL (ref 3.5–5.0)
Anion gap: 11 (ref 5–15)
BUN: 28 mg/dL — ABNORMAL HIGH (ref 6–20)
CO2: 21 mmol/L — ABNORMAL LOW (ref 22–32)
Calcium: 9.7 mg/dL (ref 8.9–10.3)
Chloride: 109 mmol/L (ref 98–111)
Creatinine, Ser: 4.02 mg/dL — ABNORMAL HIGH (ref 0.44–1.00)
GFR, Estimated: 13 mL/min — ABNORMAL LOW (ref >60)
Glucose, Bld: 160 mg/dL — ABNORMAL HIGH (ref 70–99)
Phosphorus: 3.8 mg/dL (ref 2.5–4.6)
Potassium: 3.8 mmol/L (ref 3.5–5.1)
Sodium: 141 mmol/L (ref 135–145)

## 2022-11-13 LAB — IRON AND TIBC
Iron: 52 ug/dL (ref 28–170)
Saturation Ratios: 17 % (ref 10.4–31.8)
TIBC: 309 ug/dL (ref 250–450)
UIBC: 257 ug/dL

## 2022-11-13 LAB — POCT HEMOGLOBIN-HEMACUE: Hemoglobin: 10 g/dL — ABNORMAL LOW (ref 12.0–15.0)

## 2022-11-13 MED ORDER — EPOETIN ALFA-EPBX 10000 UNIT/ML IJ SOLN
INTRAMUSCULAR | Status: AC
  Filled 2022-11-13: qty 2

## 2022-11-13 MED ORDER — EPOETIN ALFA-EPBX 10000 UNIT/ML IJ SOLN
20000.0000 [IU] | INTRAMUSCULAR | Status: DC
  Administered 2022-11-13: 20000 [IU] via SUBCUTANEOUS

## 2022-11-19 ENCOUNTER — Other Ambulatory Visit (HOSPITAL_COMMUNITY): Payer: Self-pay

## 2022-11-19 ENCOUNTER — Encounter: Payer: Self-pay | Admitting: Oncology

## 2022-11-19 ENCOUNTER — Encounter (HOSPITAL_COMMUNITY): Payer: Self-pay | Admitting: Nephrology

## 2022-11-19 MED ORDER — VICTOZA 18 MG/3ML ~~LOC~~ SOPN
1.8000 mg | PEN_INJECTOR | Freq: Every day | SUBCUTANEOUS | 2 refills | Status: DC
  Filled 2022-11-19: qty 9, 30d supply, fill #0
  Filled 2022-12-21: qty 9, 30d supply, fill #1

## 2022-12-11 ENCOUNTER — Encounter (HOSPITAL_COMMUNITY): Payer: Medicaid Other

## 2022-12-21 ENCOUNTER — Other Ambulatory Visit (HOSPITAL_COMMUNITY): Payer: Self-pay

## 2022-12-21 ENCOUNTER — Encounter (HOSPITAL_COMMUNITY): Payer: Self-pay | Admitting: Nephrology

## 2022-12-21 ENCOUNTER — Other Ambulatory Visit: Payer: Self-pay

## 2022-12-21 ENCOUNTER — Encounter: Payer: Self-pay | Admitting: Oncology

## 2022-12-25 DIAGNOSIS — Z4932 Encounter for adequacy testing for peritoneal dialysis: Secondary | ICD-10-CM | POA: Insufficient documentation

## 2023-01-04 ENCOUNTER — Other Ambulatory Visit (HOSPITAL_COMMUNITY): Payer: Self-pay

## 2023-01-08 ENCOUNTER — Encounter (HOSPITAL_COMMUNITY): Payer: Medicaid Other

## 2023-01-08 ENCOUNTER — Other Ambulatory Visit (HOSPITAL_COMMUNITY): Payer: Self-pay

## 2023-02-05 ENCOUNTER — Encounter: Payer: Self-pay | Admitting: Oncology

## 2023-02-05 ENCOUNTER — Encounter (HOSPITAL_COMMUNITY): Payer: Self-pay | Admitting: Nephrology

## 2023-03-01 DIAGNOSIS — K449 Diaphragmatic hernia without obstruction or gangrene: Secondary | ICD-10-CM | POA: Insufficient documentation

## 2023-03-07 ENCOUNTER — Other Ambulatory Visit: Payer: Self-pay | Admitting: Physician Assistant

## 2023-03-07 DIAGNOSIS — J4520 Mild intermittent asthma, uncomplicated: Secondary | ICD-10-CM | POA: Insufficient documentation

## 2023-03-07 DIAGNOSIS — Z1231 Encounter for screening mammogram for malignant neoplasm of breast: Secondary | ICD-10-CM

## 2023-03-07 DIAGNOSIS — N185 Chronic kidney disease, stage 5: Secondary | ICD-10-CM | POA: Insufficient documentation

## 2023-03-07 DIAGNOSIS — K5900 Constipation, unspecified: Secondary | ICD-10-CM | POA: Insufficient documentation

## 2023-03-08 ENCOUNTER — Encounter: Payer: Self-pay | Admitting: Oncology

## 2023-03-08 ENCOUNTER — Encounter (HOSPITAL_COMMUNITY): Payer: Self-pay | Admitting: Nephrology

## 2023-03-21 HISTORY — PX: KIDNEY TRANSPLANT: SHX239

## 2023-03-22 ENCOUNTER — Encounter (HOSPITAL_COMMUNITY): Payer: Self-pay | Admitting: Nephrology

## 2023-03-22 ENCOUNTER — Encounter: Payer: Self-pay | Admitting: Oncology

## 2023-03-25 DIAGNOSIS — Z940 Kidney transplant status: Secondary | ICD-10-CM | POA: Insufficient documentation

## 2023-03-25 DIAGNOSIS — D849 Immunodeficiency, unspecified: Secondary | ICD-10-CM | POA: Insufficient documentation

## 2023-03-29 ENCOUNTER — Ambulatory Visit: Payer: Medicaid Other

## 2023-04-03 DIAGNOSIS — N390 Urinary tract infection, site not specified: Secondary | ICD-10-CM | POA: Insufficient documentation

## 2023-04-03 DIAGNOSIS — N289 Disorder of kidney and ureter, unspecified: Secondary | ICD-10-CM | POA: Insufficient documentation

## 2023-04-04 DIAGNOSIS — T8612 Kidney transplant failure: Secondary | ICD-10-CM | POA: Insufficient documentation

## 2023-04-29 ENCOUNTER — Ambulatory Visit
Admission: EM | Admit: 2023-04-29 | Discharge: 2023-04-29 | Disposition: A | Discharge status: Home / Self Care | Payer: Medicare Other

## 2023-04-29 ENCOUNTER — Encounter (HOSPITAL_COMMUNITY): Payer: Self-pay | Admitting: Nephrology

## 2023-04-29 ENCOUNTER — Encounter: Payer: Self-pay | Admitting: Oncology

## 2023-04-29 DIAGNOSIS — R21 Rash and other nonspecific skin eruption: Secondary | ICD-10-CM

## 2023-04-29 MED ORDER — TRIAMCINOLONE ACETONIDE 0.1 % EX CREA
1.0000 | TOPICAL_CREAM | Freq: Two times a day (BID) | CUTANEOUS | 0 refills | Status: AC
Start: 2023-04-29 — End: ?

## 2023-04-29 MED ORDER — PREDNISONE 20 MG PO TABS: 40.0000 mg | ORAL_TABLET | Freq: Every day | ORAL | 0 refills | Status: AC

## 2023-04-29 NOTE — ED Triage Notes (Signed)
Generalized upper torso/extremity rash x 2-3 days.  Started on torso and spread. Reports itching.   Had seafood with others on Thursday and they also had rash. Pt does not have any known seafood allergies. No new detergent, soap, etc.

## 2023-04-29 NOTE — ED Provider Notes (Signed)
[facility]    CSN: [id number] Arrival date & time: 04/29/23  1420      History   Chief Complaint Chief Complaint  Patient presents with   Rash    HPI Renee Alexander is a 52 y.o. female.   Patient here today for evaluation of rash to her upper torso and bilateral upper extremities that started 2 to 3 days ago after she ate some seafood.  She notes her daughter had similar rash but hers resolved with Benadryl.  Patient states she has taken Benadryl which did help with itch but has not fully resolved rash.  She has tried using over-the-counter hydrocortisone cream without resolution as well.  She denies fever.  She denies any shortness of breath or trouble swallowing.   Rash Associated symptoms: no abdominal pain, no fever, no nausea, no shortness of breath and not vomiting     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   Papilloma of left breast    SVD (spontaneous vaginal delivery)    x 4    Patient Active Problem List   Diagnosis Date Noted   Kidney transplant failure 04/04/2023   Nonfunctioning kidney 10-Apr-2023   UTI due to extended-spectrum beta lactamase (ESBL) producing Escherichia coli 10-Apr-2023   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/25/2023   Immunosuppression (HCC) 03/25/2023   Chronic kidney disease, stage 5 (HCC) 03/07/2023   Constipation 03/07/2023   Mild intermittent asthma 03/07/2023   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   Encounter for adequacy testing for peritoneal dialysis (HCC) 12/25/2022   Allergy, unspecified, initial encounter 11/08/2022   Anaphylactic shock, unspecified, initial encounter 11/08/2022    Anogenital herpesviral infection, unspecified 11/08/2022   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/08/2022   Encounter for fitting and adjustment of peritoneal dialysis catheter (HCC) 11/08/2022   Encounter for immunization 11/08/2022   Encounter for screening for respiratory tuberculosis 11/08/2022   Hyperaldosteronism, unspecified (HCC) 11/08/2022   Primary hyperaldosteronism (HCC) 11/08/2022   Proteinuria, unspecified 11/08/2022   Nephrotic syndrome with unspecified morphologic changes 11/08/2022   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 11/08/2022   Unspecified asthma, uncomplicated 11/06/2022   Dependence on renal dialysis (HCC) 11/06/2022   Hypertensive chronic kidney disease with stage 5 chronic kidney disease or end stage renal disease (HCC) 11/06/2022   Long term (current) use of oral hypoglycemic drugs 11/06/2022   Long-term (current) use of injectable non-insulin antidiabetic drugs 11/06/2022   Vitamin D deficiency, unspecified 11/06/2022   Anemia in chronic kidney disease 11/06/2022   Resistant hypertension 11/06/2022   Stage 4 chronic kidney disease due to type 2 diabetes mellitus (HCC) 09/06/2022   Type 2 diabetes mellitus (HCC) 09/06/2022   End stage renal disease (HCC) 09/06/2022   Pre-transplant evaluation for kidney transplant 12/07/2020   T2DM (type 2 diabetes mellitus) (HCC) 08/06/2020   Chronic kidney disease (CKD), stage IV (severe) (HCC) 08/06/2020   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   Focal glomerular sclerosis 08/06/2020   Hypercalcemia 08/06/2020   Hyperparathyroidism due to renal insufficiency (HCC) 08/06/2020   Chronic kidney disease, stage 3 (HCC)  07/15/2020   Type II diabetes mellitus with hyperosmolarity, uncontrolled (HCC) 07/15/2020   Increased creatine kinase level 07/15/2020   Fibroid uterus 07/29/2018   Abnormal uterine bleeding 07/29/2018   Intraductal papilloma of left breast  04/09/2018   Gastroesophageal reflux disease without esophagitis 03/25/2018   HLD (hyperlipidemia) 03/11/2017   Hypertensive urgency 03/08/2017   Orthostatic hypotension 03/08/2017   Asthma 03/08/2017   Diabetes mellitus with complication (HCC) 03/08/2017   Essential hypertension 03/08/2017   Hypertensive disorder 03/08/2017   Anemia 06/17/2015   Shortness of breath 06/17/2015   HTN (hypertension) 06/17/2015   Iron deficiency anemia 05/07/2013   Weakness generalized 05/06/2013   Acute blood loss anemia 05/06/2013   Hypokalemia 05/06/2013   Chest pain 05/06/2013    Past Surgical History:  Procedure Laterality Date   BREAST LUMPECTOMY WITH RADIOACTIVE SEED LOCALIZATION Left 04/09/2018   Procedure: BREAST LUMPECTOMY WITH RADIOACTIVE SEED LOCALIZATION;  Surgeon: Claud Kelp, MD;  Location: [facility];  Service: General;  Laterality: Left;   CAPD INSERTION N/A 11/01/2022   Procedure: LAPAROSCOPIC INSERTION CONTINUOUS AMBULATORY PERITONEAL DIALYSIS  (CAPD) CATHETER;  Surgeon: Maeola Harman, MD;  Location: [facility] OR;  Service: Vascular;  Laterality: N/A;   CESAREAN SECTION     x 1-twins   HYSTERECTOMY ABDOMINAL WITH SALPINGECTOMY Bilateral 07/29/2018   Procedure: HYSTERECTOMY ABDOMINAL WITH SALPINGECTOMY;  Surgeon: Conan Bowens, MD;  Location: [facility];  Service: Gynecology;  Laterality: Bilateral;   KIDNEY TRANSPLANT Right 03/21/2023   then removed on 04/05/2023   TUBAL LIGATION     x2   WISDOM TOOTH EXTRACTION      OB History     Gravida  10   Para  5   Term  4   Preterm  1   AB  5   Living  6      SAB      IAB  5   Ectopic      Multiple  1   Live Births  6            Home Medications    Prior to Admission medications   Medication Sig Start Date End Date Taking? Authorizing Provider  acetaminophen (TYLENOL) 500 MG tablet Take 500-1,000 mg by mouth every 6 (six) hours as needed (pain).   Yes [provider]   albuterol (PROVENTIL HFA;VENTOLIN HFA) 108 (90 BASE) MCG/ACT inhaler Inhale 2 puffs into the lungs every 6 (six) hours as needed for wheezing or shortness of breath. 06/19/15  Yes Regalado, Belkys A, MD  amLODipine (NORVASC) 10 MG tablet Take 1 tablet (10 mg total) by mouth daily. Patient taking differently: Take 10 mg by mouth at bedtime. 06/19/15  Yes Regalado, Belkys A, MD  furosemide (LASIX) 20 MG tablet Take 20 mg by mouth.   Yes [provider]  metoprolol succinate (TOPROL-XL) 100 MG 24 hr tablet Take 100 mg by mouth in the morning. Take with or immediately following a meal.   Yes [provider]  omeprazole (PRILOSEC) 20 MG capsule Take 20 mg by mouth daily before breakfast.   Yes [provider]  predniSONE (DELTASONE) 20 MG tablet Take 2 tablets (40 mg total) by mouth daily with breakfast for 5 days. 04/29/23 05/04/23 Yes Tomi Bamberger, PA-C  predniSONE (DELTASONE) 5 MG tablet Take 5 mg by mouth daily with breakfast.   Yes [provider]  tacrolimus (PROGRAF) 1 MG capsule Take 1 mg by mouth 2 (two) times daily.   Yes [provider]  triamcinolone cream (KENALOG) 0.1 % Apply 1 Application topically 2 (two) times daily. 04/29/23  Yes Tomi Bamberger, PA-C    Family History Family History  Problem Relation Age of Onset   Drug abuse Mother    Diabetes Mellitus II Father    Liver disease Father    Breast cancer Sister    Colon cancer Neg Hx    Colon polyps Neg Hx    Esophageal cancer Neg Hx    Stomach cancer Neg Hx    Rectal cancer Neg Hx     Social History Social History   Tobacco Use   Smoking status: Never   Smokeless tobacco: Never  Vaping Use   Vaping status: Never Used  Substance Use Topics   Alcohol use: No    Comment: maybe 1 sip of wine ocassional   Drug use: Never     Allergies   Penicillins   Review of Systems Review of Systems  Constitutional:  Negative for chills and fever.  HENT:  Negative for facial  swelling and trouble swallowing.   Eyes:  Negative for discharge and redness.  Respiratory:  Negative for shortness of breath.   Gastrointestinal:  Negative for abdominal pain, nausea and vomiting.  Skin:  Positive for rash.     Physical Exam Triage Vital Signs ED Triage Vitals  Encounter Vitals Group     BP      Systolic BP Percentile      Diastolic BP Percentile      Pulse      Resp      Temp      Temp src      SpO2      Weight      Height      Head Circumference      Peak Flow      Pain Score      Pain Loc      Pain Education      Exclude from Growth Chart    No data found.  Updated Vital Signs BP (!) 152/83 (BP Location: Right Arm)   Pulse 65   Temp 98.2 F (36.8 C) (Oral)   Resp 16   LMP 07/07/2018 (Exact Date)   SpO2 98%     Physical Exam Vitals and nursing note reviewed.  Constitutional:      General: She is not in acute distress.    Appearance: Normal appearance. She is not ill-appearing.  HENT:     Head: Normocephalic and atraumatic.  Eyes:     Conjunctiva/sclera: Conjunctivae normal.  Cardiovascular:     Rate and Rhythm: Normal rate.  Pulmonary:     Effort: Pulmonary effort is normal. No respiratory distress.  Skin:    Comments: Diffuse pinpoint papular eruption to bilateral upper and lower arms  Neurological:     Mental Status: She is alert.  Psychiatric:        Mood and Affect: Mood normal.        Behavior: Behavior normal.        Thought Content: Thought content normal.      UC Treatments / Results  Labs (all labs ordered are listed, but only abnormal results are displayed) Labs Reviewed - No data to display  EKG   Radiology No results found.  Procedures Procedures (including critical care time)  Medications Ordered in UC Medications - No data to display  Initial Impression / Assessment and Plan / UC Course  I have reviewed the triage vital signs  and the nursing notes.  Pertinent labs & imaging results that were  available during my care of the patient were reviewed by me and considered in my medical decision making (see chart for details).    Steroid burst prescribed for treatment of suspected allergic reaction.  Patient's last hemoglobin A1c was 6.2.  Discussed that prednisone may elevate blood sugars.  Encouraged follow-up if no gradual improvement with any worsening symptoms that she report to the ED.  Final Clinical Impressions(s) / UC Diagnoses   Final diagnoses:  Rash and nonspecific skin eruption   Discharge Instructions   None    ED Prescriptions     Medication Sig Dispense Auth. Provider   predniSONE (DELTASONE) 20 MG tablet Take 2 tablets (40 mg total) by mouth daily with breakfast for 5 days. 10 tablet Erma Pinto F, PA-C   triamcinolone cream (KENALOG) 0.1 % Apply 1 Application topically 2 (two) times daily. 30 g Tomi Bamberger, PA-C      PDMP not reviewed this encounter.   Tomi Bamberger, PA-C 04/29/23 1511

## 2024-08-25 ENCOUNTER — Other Ambulatory Visit: Payer: Self-pay | Admitting: Urgent Care

## 2024-08-25 ENCOUNTER — Encounter: Payer: Self-pay | Admitting: Oncology

## 2024-08-25 ENCOUNTER — Encounter (HOSPITAL_COMMUNITY): Payer: Self-pay | Admitting: Nephrology

## 2024-08-25 DIAGNOSIS — Z1231 Encounter for screening mammogram for malignant neoplasm of breast: Secondary | ICD-10-CM

## 2024-09-01 ENCOUNTER — Encounter: Payer: Self-pay | Admitting: Oncology

## 2024-09-01 ENCOUNTER — Encounter (HOSPITAL_COMMUNITY): Payer: Self-pay | Admitting: Nephrology

## 2024-09-08 ENCOUNTER — Ambulatory Visit
Admission: RE | Admit: 2024-09-08 | Discharge: 2024-09-08 | Disposition: A | Source: Ambulatory Visit | Attending: Urgent Care | Admitting: Urgent Care

## 2024-09-08 DIAGNOSIS — Z1231 Encounter for screening mammogram for malignant neoplasm of breast: Secondary | ICD-10-CM
